# Patient Record
Sex: Female | Born: 1965
Health system: Southern US, Community
[De-identification: ages and names within clinical notes are randomized; demographics above are authoritative.]

## PROBLEM LIST (undated history)

## (undated) DIAGNOSIS — J302 Other seasonal allergic rhinitis: Secondary | ICD-10-CM

## (undated) DIAGNOSIS — D649 Anemia, unspecified: Secondary | ICD-10-CM

## (undated) DIAGNOSIS — F419 Anxiety disorder, unspecified: Secondary | ICD-10-CM

## (undated) DIAGNOSIS — L509 Urticaria, unspecified: Secondary | ICD-10-CM

## (undated) DIAGNOSIS — T8092XA Unspecified transfusion reaction, initial encounter: Secondary | ICD-10-CM

## (undated) HISTORY — DX: Other seasonal allergic rhinitis: J30.2

## (undated) HISTORY — DX: Unspecified transfusion reaction, initial encounter: T80.92XA

## (undated) HISTORY — DX: Urticaria, unspecified: L50.9

## (undated) HISTORY — DX: Anxiety disorder, unspecified: F41.9

## (undated) HISTORY — DX: Anemia, unspecified: D64.9

## (undated) HISTORY — PX: TUMOR REMOVAL: SHX12

## (undated) HISTORY — PX: APPENDECTOMY: SHX54

## (undated) HISTORY — PX: TUBAL LIGATION: SHX77

---

## 2008-11-07 ENCOUNTER — Emergency Department (HOSPITAL_COMMUNITY): Admission: EM | Admit: 2008-11-07 | Discharge: 2008-11-07 | Payer: Self-pay | Admitting: Family Medicine

## 2008-11-20 ENCOUNTER — Emergency Department (HOSPITAL_COMMUNITY): Admission: EM | Admit: 2008-11-20 | Discharge: 2008-11-20 | Payer: Self-pay | Admitting: Emergency Medicine

## 2010-11-30 LAB — POCT I-STAT, CHEM 8
Calcium, Ion: 1.22 mmol/L (ref 1.12–1.32)
Chloride: 107 mEq/L (ref 96–112)
Creatinine, Ser: 0.7 mg/dL (ref 0.4–1.2)
Glucose, Bld: 59 mg/dL — ABNORMAL LOW (ref 70–99)
Potassium: 3.9 mEq/L (ref 3.5–5.1)

## 2010-11-30 LAB — POCT URINALYSIS DIP (DEVICE)
Bilirubin Urine: NEGATIVE
Ketones, ur: NEGATIVE mg/dL
Nitrite: NEGATIVE
Protein, ur: NEGATIVE mg/dL
pH: 6 (ref 5.0–8.0)

## 2010-11-30 LAB — URINE CULTURE: Culture: NO GROWTH

## 2010-12-01 LAB — POCT URINALYSIS DIP (DEVICE)
Protein, ur: 100 mg/dL — AB
Specific Gravity, Urine: >=1.03 (ref 1.005–1.030)
Urobilinogen, UA: 1 mg/dL (ref 0.0–1.0)
pH: 6 (ref 5.0–8.0)

## 2012-06-05 ENCOUNTER — Ambulatory Visit (INDEPENDENT_AMBULATORY_CARE_PROVIDER_SITE_OTHER): Payer: 59 | Admitting: Internal Medicine

## 2012-06-05 VITALS — BP 108/67 | HR 69 | Temp 98.2°F | Resp 16 | Ht 63.0 in | Wt 133.0 lb

## 2012-06-05 DIAGNOSIS — B029 Zoster without complications: Secondary | ICD-10-CM

## 2012-06-05 MED ORDER — TRAMADOL HCL 50 MG PO TABS: 50.0000 mg | ORAL_TABLET | Freq: Three times a day (TID) | ORAL | Status: DC | PRN

## 2012-06-05 MED ORDER — VALACYCLOVIR HCL 1 G PO TABS: 1000.0000 mg | ORAL_TABLET | Freq: Three times a day (TID) | ORAL | Status: DC

## 2012-06-05 NOTE — Progress Notes (Signed)
  Subjective:    Patient ID: Danielle Banks, female    DOB: 03/15/1966, 46 y.o.   MRN: [id number]  HPI Rash  leftt chest wal Onset 4 days ago Pain started before the rash Severe 10/10 No fever  l  Review of Systems  Constitutional: Negative.   HENT: Negative.   Eyes: Negative.   Respiratory: Negative.   Cardiovascular: Negative.   Gastrointestinal: Negative.   Genitourinary: Negative.   Musculoskeletal: Negative.   Skin: Positive for rash.  Neurological: Negative.   Hematological: Negative.   Psychiatric/Behavioral: Negative.   All other systems reviewed and are negative.       Objective:   Physical Exam  Vitals reviewed. Constitutional: She appears well-developed and well-nourished.  HENT:  Head: Normocephalic and atraumatic.  Right Ear: External ear normal.  Left Ear: External ear normal.  Nose: Nose normal.  Mouth/Throat: Oropharynx is clear and moist.  Eyes: Conjunctivae normal and EOM are normal. Pupils are equal, round, and reactive to light.  Neck: Normal range of motion. Neck supple.  Cardiovascular: Normal rate, regular rhythm and normal heart sounds.   Pulmonary/Chest: Effort normal and breath sounds normal.  Abdominal: Soft. Bowel sounds are normal.  Musculoskeletal: Normal range of motion.  Neurological: She is alert. She has normal reflexes.  Skin: Rash noted.       Vesicular rash left chest wall in a dermatome distribution extending towards the back  Psychiatric: She has a normal mood and affect. Her behavior is normal. Judgment and thought content normal.          Assessment & Plan:  Shingles plan antivirals and analgesics

## 2012-06-05 NOTE — Patient Instructions (Signed)
Valtrex as directed. Ultram as directed for pain. followup in one week.

## 2012-08-02 ENCOUNTER — Encounter (HOSPITAL_COMMUNITY): Payer: Self-pay | Admitting: Emergency Medicine

## 2012-08-02 ENCOUNTER — Emergency Department (HOSPITAL_COMMUNITY)
Admission: EM | Admit: 2012-08-02 | Discharge: 2012-08-02 | Disposition: A | Discharge status: Home / Self Care | Payer: 59 | Attending: Emergency Medicine | Admitting: Emergency Medicine

## 2012-08-02 ENCOUNTER — Emergency Department (HOSPITAL_COMMUNITY): Payer: 59

## 2012-08-02 DIAGNOSIS — Z862 Personal history of diseases of the blood and blood-forming organs and certain disorders involving the immune mechanism: Secondary | ICD-10-CM | POA: Insufficient documentation

## 2012-08-02 DIAGNOSIS — R109 Unspecified abdominal pain: Secondary | ICD-10-CM | POA: Insufficient documentation

## 2012-08-02 DIAGNOSIS — Z79899 Other long term (current) drug therapy: Secondary | ICD-10-CM | POA: Insufficient documentation

## 2012-08-02 DIAGNOSIS — R197 Diarrhea, unspecified: Secondary | ICD-10-CM | POA: Insufficient documentation

## 2012-08-02 DIAGNOSIS — IMO0001 Reserved for inherently not codable concepts without codable children: Secondary | ICD-10-CM | POA: Insufficient documentation

## 2012-08-02 DIAGNOSIS — F411 Generalized anxiety disorder: Secondary | ICD-10-CM | POA: Insufficient documentation

## 2012-08-02 DIAGNOSIS — Z3202 Encounter for pregnancy test, result negative: Secondary | ICD-10-CM | POA: Insufficient documentation

## 2012-08-02 DIAGNOSIS — R5381 Other malaise: Secondary | ICD-10-CM | POA: Insufficient documentation

## 2012-08-02 DIAGNOSIS — R5383 Other fatigue: Secondary | ICD-10-CM | POA: Insufficient documentation

## 2012-08-02 DIAGNOSIS — R112 Nausea with vomiting, unspecified: Secondary | ICD-10-CM | POA: Insufficient documentation

## 2012-08-02 LAB — CBC WITH DIFFERENTIAL/PLATELET
Basophils Absolute: 0 10*3/uL (ref 0.0–0.1)
Eosinophils Absolute: 0.1 10*3/uL (ref 0.0–0.7)
Eosinophils Relative: 1 % (ref 0–5)
HCT: 37.3 % (ref 36.0–46.0)
Lymphocytes Relative: 2 % — ABNORMAL LOW (ref 12–46)
MCH: 27.8 pg (ref 26.0–34.0)
MCHC: 33 g/dL (ref 30.0–36.0)
MCV: 84.4 fL (ref 78.0–100.0)
Monocytes Absolute: 0.2 10*3/uL (ref 0.1–1.0)
Platelets: 241 10*3/uL (ref 150–400)
RDW: 14.6 % (ref 11.5–15.5)
WBC: 11 10*3/uL — ABNORMAL HIGH (ref 4.0–10.5)

## 2012-08-02 LAB — URINALYSIS, ROUTINE W REFLEX MICROSCOPIC
Hgb urine dipstick: NEGATIVE
Nitrite: NEGATIVE
Specific Gravity, Urine: 1.029 (ref 1.005–1.030)
Urobilinogen, UA: 0.2 mg/dL (ref 0.0–1.0)
pH: 7.5 (ref 5.0–8.0)

## 2012-08-02 LAB — COMPREHENSIVE METABOLIC PANEL
AST: 19 U/L (ref 0–37)
CO2: 24 mEq/L (ref 19–32)
Calcium: 8.7 mg/dL (ref 8.4–10.5)
Creatinine, Ser: 0.6 mg/dL (ref 0.50–1.10)
GFR calc Af Amer: >90 mL/min (ref >90)
GFR calc non Af Amer: >90 mL/min (ref >90)
Total Protein: 6.7 g/dL (ref 6.0–8.3)

## 2012-08-02 LAB — LIPASE, BLOOD: Lipase: 22 U/L (ref 11–59)

## 2012-08-02 LAB — PREGNANCY, URINE: Preg Test, Ur: NEGATIVE

## 2012-08-02 MED ORDER — MORPHINE SULFATE 4 MG/ML IJ SOLN: 4.0000 mg | Freq: Once | INTRAMUSCULAR | Status: DC

## 2012-08-02 MED ORDER — HYOSCYAMINE SULFATE 0.125 MG SL SUBL: 0.1250 mg | SUBLINGUAL_TABLET | SUBLINGUAL | Status: DC | PRN

## 2012-08-02 MED ORDER — MORPHINE SULFATE 4 MG/ML IJ SOLN
2.0000 mg | Freq: Once | INTRAMUSCULAR | Status: AC
  Administered 2012-08-02: 2 mg via INTRAVENOUS
  Filled 2012-08-02: qty 1

## 2012-08-02 MED ORDER — ONDANSETRON 8 MG PO TBDP: 8.0000 mg | ORAL_TABLET | Freq: Three times a day (TID) | ORAL | Status: DC | PRN

## 2012-08-02 MED ORDER — SODIUM CHLORIDE 0.9 % IV BOLUS (SEPSIS)
1000.0000 mL | Freq: Once | INTRAVENOUS | Status: AC
  Administered 2012-08-02: 1000 mL via INTRAVENOUS

## 2012-08-02 MED ORDER — ONDANSETRON HCL 4 MG/2ML IJ SOLN
4.0000 mg | Freq: Once | INTRAMUSCULAR | Status: AC
  Administered 2012-08-02: 4 mg via INTRAVENOUS
  Filled 2012-08-02: qty 2

## 2012-08-02 MED ORDER — PANTOPRAZOLE SODIUM 40 MG IV SOLR
40.0000 mg | Freq: Once | INTRAVENOUS | Status: AC
  Administered 2012-08-02: 40 mg via INTRAVENOUS
  Filled 2012-08-02: qty 40

## 2012-08-02 NOTE — Discharge Instructions (Signed)
Stay on clear fluids today. Zofran for nausea as prescribed as needed. Advance diet tomorrow as tolerate. Follow up with primary care doctor. Return if symptoms are worsening, if develop worsening nausea, vomiting, abdominal pain, unable to keep fluids down at home, or any new concerning finding.     Viral Gastroenteritis Viral gastroenteritis is also known as stomach flu. This condition affects the stomach and intestinal tract. It can cause sudden diarrhea and vomiting. The illness typically lasts 3 to 8 days. Most people develop an immune response that eventually gets rid of the virus. While this natural response develops, the virus can make you quite ill. CAUSES  Many different viruses can cause gastroenteritis, such as rotavirus or noroviruses. You can catch one of these viruses by consuming contaminated food or water. You may also catch a virus by sharing utensils or other personal items with an infected person or by touching a contaminated surface. SYMPTOMS  The most common symptoms are diarrhea and vomiting. These problems can cause a severe loss of body fluids (dehydration) and a body salt (electrolyte) imbalance. Other symptoms may include:  Fever.  Headache.  Fatigue.  Abdominal pain. DIAGNOSIS  Your caregiver can usually diagnose viral gastroenteritis based on your symptoms and a physical exam. A stool sample may also be taken to test for the presence of viruses or other infections. TREATMENT  This illness typically goes away on its own. Treatments are aimed at rehydration. The most serious cases of viral gastroenteritis involve vomiting so severely that you are not able to keep fluids down. In these cases, fluids must be given through an intravenous line (IV). HOME CARE INSTRUCTIONS   Drink enough fluids to keep your urine clear or pale yellow. Drink small amounts of fluids frequently and increase the amounts as tolerated.  Ask your caregiver for specific rehydration  instructions.  Avoid:  Foods high in sugar.  Alcohol.  Carbonated drinks.  Tobacco.  Juice.  Caffeine drinks.  Extremely hot or cold fluids.  Fatty, greasy foods.  Too much intake of anything at one time.  Dairy products until 24 to 48 hours after diarrhea stops.  You may consume probiotics. Probiotics are active cultures of beneficial bacteria. They may lessen the amount and number of diarrheal stools in adults. Probiotics can be found in yogurt with active cultures and in supplements.  Wash your hands well to avoid spreading the virus.  Only take over-the-counter or prescription medicines for pain, discomfort, or fever as directed by your caregiver. Do not give aspirin to children. Antidiarrheal medicines are not recommended.  Ask your caregiver if you should continue to take your regular prescribed and over-the-counter medicines.  Keep all follow-up appointments as directed by your caregiver. SEEK IMMEDIATE MEDICAL CARE IF:   You are unable to keep fluids down.  You do not urinate at least once every 6 to 8 hours.  You develop shortness of breath.  You notice blood in your stool or vomit. This may look like coffee grounds.  You have abdominal pain that increases or is concentrated in one small area (localized).  You have persistent vomiting or diarrhea.  You have a fever.  The patient is a child younger than 3 months, and he or she has a fever.  The patient is a child older than 3 months, and he or she has a fever and persistent symptoms.  The patient is a child older than 3 months, and he or she has a fever and symptoms suddenly get worse.  The patient is a baby, and he or she has no tears when crying. MAKE SURE YOU:   Understand these instructions.  Will watch your condition.  Will get help right away if you are not doing well or get worse. Document Released: 08/07/2005 Document Revised: 10/30/2011 Document Reviewed: 05/24/2011 [facility] Patient  Information 2013 [place], [place].

## 2012-08-02 NOTE — ED Provider Notes (Signed)
History     CSN: [id number]  Arrival date & time 08/02/12  [id number]   First MD Initiated Contact with Patient 08/02/12 0348      No chief complaint on file.   (Consider location/radiation/quality/duration/timing/severity/associated sxs/prior treatment) HPI Danielle Banks is a 46 y.o. female who presents with complaint of nausea, vomiting, abdominal pain, 1 episode of diarrhea since 6pm last night. Pt states she has vomited more than 10 times, unable to keep anything down. States pain in abdomen mainly epigastric, thinks it is from vomiting, describes as "cramping." Pt's husband states "I think she has ulcers, she had blood in one of the episodes of vomiting, and she always has stomach problems."  Pt denies fever, chills. No back pain or urinary symptoms. States feels very weak.  Nothing making her symptoms better or worse. 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  Past Medical History  Diagnosis Date  . Seasonal allergies   . Anemia   . Anxiety   . Blood transfusion abn reaction or complication, no procedure mishap     Past Surgical History  Procedure Date  . Appendectomy   . Tubal ligation   . Tumor removal     necrotic tumor removal    Family History  Problem Relation Age of Onset  . Hyperlipidemia Mother   . Osteoarthritis Mother   . Heart failure Father   . Hypertension Father   . Asthma Sister   . Asthma Daughter   . Hypertension Maternal Grandmother   . Stroke Paternal Grandmother   . Early death Paternal Grandfather     History  Substance Use Topics  . Smoking status: Never Smoker   . Smokeless tobacco: Not on file  . Alcohol Use: No    OB History    Grav Para Term Preterm Abortions TAB SAB Ect Mult Living                  Review of Systems  Constitutional: Positive for fatigue. Negative for fever and chills.  HENT: Negative for neck pain and neck stiffness.   Respiratory: Negative.   Cardiovascular: Negative.   Gastrointestinal: Positive for nausea,  vomiting, abdominal pain and diarrhea.  Genitourinary: Negative for dysuria, flank pain, vaginal bleeding, vaginal discharge and pelvic pain.  Musculoskeletal: Positive for myalgias. Negative for back pain.  Neurological: Positive for weakness. Negative for dizziness, light-headedness and headaches.  All other systems reviewed and are negative.    Allergies  Kiwi extract  Home Medications   Current Outpatient Rx  Name  Route  Sig  Dispense  Refill  . PAROXETINE HCL 10 MG PO TABS   Oral   Take 10 mg by mouth every morning.         Marland Kitchen TRAMADOL HCL 50 MG PO TABS   Oral   Take 1 tablet (50 mg total) by mouth every 8 (eight) hours as needed for pain.   30 tablet   0   . VALACYCLOVIR HCL 1 G PO TABS   Oral   Take 1 tablet (1,000 mg total) by mouth 3 (three) times daily.   20 tablet   0     BP 105/54  Pulse 110  Temp 98.7 F (37.1 C) (Oral)  Resp 16  Ht 5\' 2"  (1.575 m)  Wt 118 lb (53.524 kg)  BMI 21.58 kg/m2  SpO2 98%  LMP 07/26/2012  Physical Exam  Nursing note and vitals reviewed. Constitutional: She is oriented to person, place, and time. She  appears well-developed and well-nourished. No distress.  HENT:       Oral mucosa dry, lips dry  Neck: Neck supple.  Cardiovascular: Regular rhythm and normal heart sounds.        tachycardic  Pulmonary/Chest: Effort normal and breath sounds normal. No respiratory distress. She has no wheezes. She has no rales.  Abdominal: Soft. Bowel sounds are normal. She exhibits no distension. There is tenderness. There is no rebound and no guarding.       Diffuse abdominal tenderness. Worst in the epigastric area  Musculoskeletal: She exhibits no edema.  Neurological: She is alert and oriented to person, place, and time.  Skin: Skin is warm and dry.    ED Course  Procedures (including critical care time)  4:03 AM Pt seen and examined. Pt is a nurse in ICU. Pt with sudden onset of nausea, vomiting, abdominal pain, diarrhea. Will  fluids, medications, labs pending.  Results for orders placed during the hospital encounter of 08/02/12  CBC WITH DIFFERENTIAL      Component Value Range   WBC 11.0 (*) 4.0 - 10.5 K/uL   RBC 4.42  3.87 - 5.11 MIL/uL   Hemoglobin 12.3  12.0 - 15.0 g/dL   HCT 16.1  09.6 - 04.5 %   MCV 84.4  78.0 - 100.0 fL   MCH 27.8  26.0 - 34.0 pg   MCHC 33.0  30.0 - 36.0 g/dL   RDW 40.9  81.1 - 91.4 %   Platelets 241  150 - 400 K/uL   Neutrophils Relative 96 (*) 43 - 77 %   Neutro Abs 10.5 (*) 1.7 - 7.7 K/uL   Lymphocytes Relative 2 (*) 12 - 46 %   Lymphs Abs 0.2 (*) 0.7 - 4.0 K/uL   Monocytes Relative 2 (*) 3 - 12 %   Monocytes Absolute 0.2  0.1 - 1.0 K/uL   Eosinophils Relative 1  0 - 5 %   Eosinophils Absolute 0.1  0.0 - 0.7 K/uL   Basophils Relative 0  0 - 1 %   Basophils Absolute 0.0  0.0 - 0.1 K/uL  COMPREHENSIVE METABOLIC PANEL      Component Value Range   Sodium 137  135 - 145 mEq/L   Potassium 3.9  3.5 - 5.1 mEq/L   Chloride 103  96 - 112 mEq/L   CO2 24  19 - 32 mEq/L   Glucose, Bld 157 (*) 70 - 99 mg/dL   BUN 16  6 - 23 mg/dL   Creatinine, Ser 7.82  0.50 - 1.10 mg/dL   Calcium 8.7  8.4 - 95.6 mg/dL   Total Protein 6.7  6.0 - 8.3 g/dL   Albumin 4.0  3.5 - 5.2 g/dL   AST 19  0 - 37 U/L   ALT 7  0 - 35 U/L   Alkaline Phosphatase 58  39 - 117 U/L   Total Bilirubin 0.3  0.3 - 1.2 mg/dL   GFR calc non Af Amer >90  >90 mL/min   GFR calc Af Amer >90  >90 mL/min  URINALYSIS, ROUTINE W REFLEX MICROSCOPIC      Component Value Range   Color, Urine YELLOW  YELLOW   APPearance CLEAR  CLEAR   Specific Gravity, Urine 1.029  1.005 - 1.030   pH 7.5  5.0 - 8.0   Glucose, UA NEGATIVE  NEGATIVE mg/dL   Hgb urine dipstick NEGATIVE  NEGATIVE   Bilirubin Urine NEGATIVE  NEGATIVE   Ketones, ur NEGATIVE  NEGATIVE mg/dL   Protein, ur NEGATIVE  NEGATIVE mg/dL   Urobilinogen, UA 0.2  0.0 - 1.0 mg/dL   Nitrite NEGATIVE  NEGATIVE   Leukocytes, UA NEGATIVE  NEGATIVE  LIPASE, BLOOD      Component  Value Range   Lipase 22  11 - 59 U/L  PREGNANCY, URINE      Component Value Range   Preg Test, Ur NEGATIVE  NEGATIVE   Dg Abd Acute W/chest  08/02/2012  *RADIOLOGY REPORT*  Clinical Data: Upper abdominal pain and nausea.  ACUTE ABDOMEN SERIES (ABDOMEN 2 VIEW & CHEST 1 VIEW)  Comparison: None.  Findings: Normal heart size and pulmonary vascularity.  No focal consolidation in the lungs.  No blunting of costophrenic angles.  Scattered gas and stool in the colon.  No small or large bowel dilatation.  No free intra-abdominal air.  No abnormal air fluid levels.  No radiopaque stones.  IMPRESSION: No evidence of active pulmonary disease.  Stool filled colon. Nonobstructive bowel gas pattern.   Original Report Authenticated By: Burman Nieves, M.D.     Pt feeling better. Signed out at shift change to Dr. Hyacinth Meeker.   No diagnosis found.    MDM          Lottie Mussel, PA 08/05/12 [phone]

## 2012-08-02 NOTE — ED Notes (Signed)
Patient transported to X-ray 

## 2012-08-02 NOTE — ED Notes (Signed)
MD at bedside. 

## 2012-08-02 NOTE — ED Provider Notes (Signed)
46 year old female who presents with one day of nausea vomiting and mild abdominal tenderness. 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  On arrival the patient was extremely nauseated, she has not had any vomiting since medications and states that she feels much better. She has not had any diarrhea. Her abdomen is soft and minimally tender in the epigastrium, she has no guarding, no peritoneal signs and no tenderness at McBurney's point. Lab work overall is unremarkable, the patient has improved significantly, has been given 2 L of IV fluids and symptomatic medications and appears stable for discharge. She has a normal lipase, urinalysis is pending.  Medical screening examination/treatment/procedure(s) were conducted as a shared visit with non-physician practitioner(s) and myself.  I personally evaluated the patient during the encounter    Vida Roller, MD 08/02/12 [phone]

## 2012-08-05 NOTE — ED Provider Notes (Signed)
  Medical screening examination/treatment/procedure(s) were conducted as a shared visit with non-physician practitioner(s) and myself.  I personally evaluated the patient during the encounter  Please see my separate respective documentation pertaining to this patient encounter   Vida Roller, MD 08/05/12 0730

## 2012-10-12 ENCOUNTER — Ambulatory Visit (INDEPENDENT_AMBULATORY_CARE_PROVIDER_SITE_OTHER): Payer: 59 | Admitting: Emergency Medicine

## 2012-10-12 ENCOUNTER — Ambulatory Visit: Payer: 59

## 2012-10-12 VITALS — BP 124/76 | HR 87 | Temp 98.1°F | Resp 16 | Ht 62.5 in | Wt 132.0 lb

## 2012-10-12 DIAGNOSIS — R079 Chest pain, unspecified: Secondary | ICD-10-CM

## 2012-10-12 DIAGNOSIS — S20219A Contusion of unspecified front wall of thorax, initial encounter: Secondary | ICD-10-CM

## 2012-10-12 MED ORDER — NAPROXEN SODIUM 550 MG PO TABS: 550.0000 mg | ORAL_TABLET | Freq: Two times a day (BID) | ORAL | Status: DC
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
## 2012-10-12 NOTE — Patient Instructions (Signed)
Chest Pain (Nonspecific) It is often hard to give a specific diagnosis for the cause of chest pain. There is always a chance that your pain could be related to something serious, such as a heart attack or a blood clot in the lungs. You need to follow up with your caregiver for further evaluation. CAUSES   Heartburn.  Pneumonia or bronchitis.  Anxiety or stress.  Inflammation around your heart (pericarditis) or lung (pleuritis or pleurisy).  A blood clot in the lung.  A collapsed lung (pneumothorax). It can develop suddenly on its own (spontaneous pneumothorax) or from injury (trauma) to the chest.  Shingles infection (herpes zoster virus). The chest wall is composed of bones, muscles, and cartilage. Any of these can be the source of the pain.  The bones can be bruised by injury.  The muscles or cartilage can be strained by coughing or overwork.  The cartilage can be affected by inflammation and become sore (costochondritis). DIAGNOSIS  Lab tests or other studies, such as X-rays, electrocardiography, stress testing, or cardiac imaging, may be needed to find the cause of your pain.  TREATMENT   Treatment depends on what may be causing your chest pain. Treatment may include:  Acid blockers for heartburn.  Anti-inflammatory medicine.  Pain medicine for inflammatory conditions.  Antibiotics if an infection is present.  You may be advised to change lifestyle habits. This includes stopping smoking and avoiding alcohol, caffeine, and chocolate.  You may be advised to keep your head raised (elevated) when sleeping. This reduces the chance of acid going backward from your stomach into your esophagus.  Most of the time, nonspecific chest pain will improve within 2 to 3 days with rest and mild pain medicine. HOME CARE INSTRUCTIONS   If antibiotics were prescribed, take your antibiotics as directed. Finish them even if you start to feel better.  For the next few days, avoid physical  activities that bring on chest pain. Continue physical activities as directed.  Do not smoke.  Avoid drinking alcohol.  Only take over-the-counter or prescription medicine for pain, discomfort, or fever as directed by your caregiver.  Follow your caregiver's suggestions for further testing if your chest pain does not go away.  Keep any follow-up appointments you made. If you do not go to an appointment, you could develop lasting (chronic) problems with pain. If there is any problem keeping an appointment, you must call to reschedule. SEEK MEDICAL CARE IF:   You think you are having problems from the medicine you are taking. Read your medicine instructions carefully.  Your chest pain does not go away, even after treatment.  You develop a rash with blisters on your chest. SEEK IMMEDIATE MEDICAL CARE IF:   You have increased chest pain or pain that spreads to your arm, neck, jaw, back, or abdomen.  You develop shortness of breath, an increasing cough, or you are coughing up blood.  You have severe back or abdominal pain, feel nauseous, or vomit.  You develop severe weakness, fainting, or chills.  You have a fever. THIS IS AN EMERGENCY. Do not wait to see if the pain will go away. Get medical help at once. Call your local emergency services (911 in [place] Do not drive yourself to the hospital. MAKE SURE YOU:   Understand these instructions.  Will watch your condition.  Will get help right away if you are not doing well or get worse. Document Released: 05/17/2005 Document Revised: 10/30/2011 Document Reviewed: 03/12/2008 [employer] Patient Information 2013 [employer].  

## 2012-10-16 NOTE — Progress Notes (Signed)
Reviewed and agree.

## 2012-11-19 ENCOUNTER — Ambulatory Visit (INDEPENDENT_AMBULATORY_CARE_PROVIDER_SITE_OTHER): Payer: 59 | Admitting: Family Medicine

## 2012-11-19 VITALS — HR 90 | Temp 98.3°F | Resp 18 | Ht 62.5 in | Wt 134.0 lb

## 2012-11-19 DIAGNOSIS — F411 Generalized anxiety disorder: Secondary | ICD-10-CM

## 2012-11-19 DIAGNOSIS — J069 Acute upper respiratory infection, unspecified: Secondary | ICD-10-CM

## 2012-11-19 DIAGNOSIS — D509 Iron deficiency anemia, unspecified: Secondary | ICD-10-CM

## 2012-11-19 LAB — POCT CBC
Granulocyte percent: 59.3 %G (ref 37–80)
HCT, POC: 38.9 % (ref 37.7–47.9)
MCV: 86.9 fL (ref 80–97)
RBC: 4.48 M/uL (ref 4.04–5.48)

## 2012-11-19 MED ORDER — PAROXETINE HCL 10 MG PO TABS: 10.0000 mg | ORAL_TABLET | Freq: Every morning | ORAL | Status: DC

## 2012-11-19 NOTE — Progress Notes (Signed)
Subjective: 47 year old lady with several things. She needs a refill on her Paxil. She has been on a low dose of that for several years for anxiety. She does fair on that. She does continue to smoke. We discussed the need for quitting smoking.  She also has history deficiency anemia. She has taken iron often for this. She's been using a sample another physician gave her call Integra. She does have fairly heavy periods. She has a long history of anemia problems.  She has an upper sparked or infection last few days with a congestion sneezing sore throat and mild cough which is nonproductive. She's felt hot but not had any documented fever.  Objective: Pleasant alert lady in no major distress. TMs normal. Throat clear. Neck supple without nodes. Chest clear process. Heart regular without murmurs. No carotid bruits.  Assessment: History of anemia URI Chronic mild anxiety Tobacco abuse  Plan: Pressure tenderness of smoking. Check CBC and ferritin on her. Represcribed Paxil. Treat the URI symptomatically. Return when necessary.  Results for orders placed in visit on 11/19/12  POCT CBC      Result Value Range   WBC 6.1  4.6 - 10.2 K/uL   Lymph, poc 2.0  0.6 - 3.4   POC LYMPH PERCENT 32.6  10 - 50 %L   MID (cbc) 0.5  0 - 0.9   POC MID % 8.1  0 - 12 %M   POC Granulocyte 3.6  2 - 6.9   Granulocyte percent 59.3  37 - 80 %G   RBC 4.48  4.04 - 5.48 M/uL   Hemoglobin 12.0 (*) 12.2 - 16.2 g/dL   HCT, POC 16.1  09.6 - 47.9 %   MCV 86.9  80 - 97 fL   MCH, POC 26.8 (*) 27 - 31.2 pg   MCHC 30.8 (*) 31.8 - 35.4 g/dL   RDW, POC 04.5     Platelet Count, POC 354  142 - 424 K/uL   MPV 8.2  0 - 99.8 fL   She is not fatigued from behind deficiency. I urge her just to try and take iron regularly for about 3 months and see if she can build up her marrow stores, then dropped back to just using it at the time of her menstrual cycles.  Return if worse

## 2012-11-19 NOTE — Patient Instructions (Signed)
Fluids  Rest  Ask for a polysaccharide iron preparation and see if if they can recommend for you  Continue Paxil same dose  Return if problems

## 2012-11-21 ENCOUNTER — Other Ambulatory Visit: Payer: Self-pay | Admitting: Radiology

## 2012-11-21 NOTE — Telephone Encounter (Signed)
error 

## 2012-12-07 ENCOUNTER — Ambulatory Visit (INDEPENDENT_AMBULATORY_CARE_PROVIDER_SITE_OTHER): Payer: 59 | Admitting: Physician Assistant

## 2012-12-07 VITALS — BP 101/66 | HR 77 | Temp 99.2°F | Resp 18 | Wt 134.0 lb

## 2012-12-07 DIAGNOSIS — R81 Glycosuria: Secondary | ICD-10-CM

## 2012-12-07 DIAGNOSIS — R300 Dysuria: Secondary | ICD-10-CM

## 2012-12-07 DIAGNOSIS — N390 Urinary tract infection, site not specified: Secondary | ICD-10-CM

## 2012-12-07 DIAGNOSIS — Z131 Encounter for screening for diabetes mellitus: Secondary | ICD-10-CM

## 2012-12-07 LAB — POCT URINALYSIS DIPSTICK
Bilirubin, UA: NEGATIVE
Glucose, UA: 100
Ketones, UA: 15
Nitrite, UA: NEGATIVE
Protein, UA: >=300
Spec Grav, UA: >=1.03
Urobilinogen, UA: 0.2
pH, UA: 6

## 2012-12-07 LAB — POCT UA - MICROSCOPIC ONLY
Casts, Ur, LPF, POC: NEGATIVE
Crystals, Ur, HPF, POC: NEGATIVE
Mucus, UA: NEGATIVE
Yeast, UA: NEGATIVE

## 2012-12-07 LAB — POCT GLYCOSYLATED HEMOGLOBIN (HGB A1C): Hemoglobin A1C: 5.2

## 2012-12-07 LAB — GLUCOSE, POCT (MANUAL RESULT ENTRY): POC Glucose: 100 mg/dl — AB (ref 70–99)

## 2012-12-07 MED ORDER — SULFAMETHOXAZOLE-TRIMETHOPRIM 800-160 MG PO TABS: 1.0000 | ORAL_TABLET | Freq: Two times a day (BID) | ORAL | Status: DC

## 2012-12-07 MED ORDER — PHENAZOPYRIDINE HCL 200 MG PO TABS: 200.0000 mg | ORAL_TABLET | Freq: Three times a day (TID) | ORAL | Status: DC | PRN

## 2012-12-07 NOTE — Progress Notes (Signed)
Patient ID: Danielle Banks MRN: [id number], DOB: 01/28/1966, 47 y.o. Date of Encounter: 12/07/2012, 6:14 PM  Primary Physician: No primary provider on file.  Chief Complaint: urinary frequency and dysuria  HPI: 47 y.o. year old female with presents with 2 day history of urinary frequency, dysuria, and suprapubic pressure. Complains of spasms of bladder that are extremely uncomfortable.  Denies flank pain, nausea, vomiting, fever, chills, vaginal discharge, or odor. Has tried OTC medication for bladder infections which has not helped. LNMP 4 weeks ago - due to start soon.  Patient is otherwise doing well without issues or complaints.  Past Medical History  Diagnosis Date  . Seasonal allergies   . Anemia   . Anxiety   . Blood transfusion abn reaction or complication, no procedure mishap      Home Meds: Prior to Admission medications   Medication Sig Start Date End Date Taking? Authorizing Provider  Fe Fum-FePoly-FA-Vit C-Vit B3 (INTEGRA F) 125-1 MG CAPS Take 125 mg by mouth at bedtime as needed.    Historical Provider, MD  phenazopyridine (PYRIDIUM) 200 MG tablet Take 1 tablet (200 mg total) by mouth 3 (three) times daily as needed for pain. 12/07/12   Carter Kaman Jaquita Rector, PA-C  sulfamethoxazole-trimethoprim (BACTRIM DS,SEPTRA DS) 800-160 MG per tablet Take 1 tablet by mouth 2 (two) times daily. 12/07/12   Nelva Nay, PA-C    Allergies:  Allergies  Allergen Reactions  . Kiwi Extract Other (See Comments)    Lip tingling and numbness    History   Social History  . Marital Status: Single    Spouse Name: N/A    Number of Children: N/A  . Years of Education: College   Occupational History  . Nurse [employer]   Social History Main Topics  . Smoking status: Never Smoker   . Smokeless tobacco: Not on file  . Alcohol Use: No  . Drug Use: No  . Sexually Active: Yes    Birth Control/ Protection: Other-see comments, Surgical     Comment: Tubal ligation   Other Topics Concern    . Not on file   Social History Narrative  . No narrative on file     Review of Systems: Constitutional: negative for chills, fever, night sweats, weight changes, or fatigue  HEENT: negative for vision changes, hearing loss, congestion, rhinorrhea, ST, epistaxis, or sinus pressure Cardiovascular: negative for chest pain or palpitations Respiratory: negative for cough, hemoptysis, wheezing, shortness of breath. Abdominal: positive for suprapubic abdominal pain,   No nausea, vomiting, diarrhea, or constipation Genitourinary: positive for urinary frequency and dysuria. Negative for vaginal discharge, odor, or pelvic pain.  Dermatological: negative for rashes. Neurologic: negative for headache, dizziness, or syncope   Physical Exam: Blood pressure 101/66, pulse 77, temperature 99.2 F (37.3 C), temperature source Oral, resp. rate 18, weight 134 lb (60.782 kg), last menstrual period 11/15/2012., Body mass index is 24.1 kg/(m^2). General: Well developed, well nourished, in no acute distress. Head: Normocephalic, atraumatic, eyes without discharge, sclera non-icteric, nares are without discharge. External ear normal in appearance. Neck: Supple. No thyromegaly. Full ROM. No lymphadenopathy. Lungs: Clear bilaterally to auscultation without wheezes, rales, or rhonchi. Breathing is unlabored. Heart: RRR with S1 S2. No murmurs, rubs, or gallops appreciated. Abdominal: +BS x 4. No hepatosplenomegaly, rebound tenderness, or guarding. Positive suprapubic tenderness. No CVA tenderness bilaterally.  Msk:  Strength and tone normal for age. Extremities/Skin: Warm and dry. No clubbing or cyanosis. No edema.  Neuro: Alert and oriented X 3. Moves  all extremities spontaneously. Gait is normal. CNII-XII grossly in tact. Psych:  Responds to questions appropriately with a normal affect.   Labs:  Results for orders placed in visit on 12/07/12  POCT UA - MICROSCOPIC ONLY      Result Value Range   WBC, Ur,  HPF, POC TNTC     RBC, urine, microscopic TNTC     Bacteria, U Microscopic trace     Mucus, UA neg     Epithelial cells, urine per micros 0-2     Crystals, Ur, HPF, POC neg     Casts, Ur, LPF, POC neg     Yeast, UA neg    POCT URINALYSIS DIPSTICK      Result Value Range   Color, UA yellow     Clarity, UA cloudy     Glucose, UA 100     Bilirubin, UA neg     Ketones, UA 15     Spec Grav, UA >=1.030     Blood, UA large     pH, UA 6.0     Protein, UA >=300     Urobilinogen, UA 0.2     Nitrite, UA neg     Leukocytes, UA small (1+)    GLUCOSE, POCT (MANUAL RESULT ENTRY)      Result Value Range   POC Glucose 100 (*) 70 - 99 mg/dl  POCT GLYCOSYLATED HEMOGLOBIN (HGB A1C)      Result Value Range   Hemoglobin A1C 5.2        Pyridium 200 mg given today in office.   ASSESSMENT AND PLAN:  47 y.o. year old female with urinary tract infection Urine culture sent Increase fluids.  HgA1c and glucose normal - unsure why glucosuria today. Recommend recheck when well.  Septra DS bid x 5 days Pyridium tid as needed for symptomatic relief Follow up if symptoms worsen or fail to improve.  Grier Mitts, PA-C 12/07/2012 6:14 PM

## 2012-12-10 LAB — URINE CULTURE: Colony Count: 4000

## 2012-12-11 ENCOUNTER — Encounter: Payer: Self-pay | Admitting: Physician Assistant

## 2012-12-11 ENCOUNTER — Telehealth: Payer: Self-pay

## 2012-12-11 NOTE — Telephone Encounter (Signed)
Pt is calling back about some labs  Call back number is [phone]

## 2012-12-11 NOTE — Telephone Encounter (Signed)
Return pt call- given lab results. She will follow up if needed.

## 2013-03-05 ENCOUNTER — Ambulatory Visit (INDEPENDENT_AMBULATORY_CARE_PROVIDER_SITE_OTHER): Payer: 59 | Admitting: Physician Assistant

## 2013-03-05 VITALS — BP 106/60 | HR 75 | Temp 98.0°F | Resp 18 | Ht 64.5 in | Wt 132.8 lb

## 2013-03-05 DIAGNOSIS — J019 Acute sinusitis, unspecified: Secondary | ICD-10-CM

## 2013-03-05 DIAGNOSIS — D649 Anemia, unspecified: Secondary | ICD-10-CM

## 2013-03-05 MED ORDER — PREDNISONE 20 MG PO TABS: ORAL_TABLET | ORAL | Status: DC

## 2013-03-05 MED ORDER — INTEGRA F 125-1 MG PO CAPS: 125.0000 mg | ORAL_CAPSULE | Freq: Every evening | ORAL | Status: DC | PRN

## 2013-03-05 MED ORDER — LEVOFLOXACIN 500 MG PO TABS: 500.0000 mg | ORAL_TABLET | Freq: Every day | ORAL | Status: DC
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
## 2013-03-31 ENCOUNTER — Ambulatory Visit (INDEPENDENT_AMBULATORY_CARE_PROVIDER_SITE_OTHER): Payer: 59 | Admitting: Physician Assistant

## 2013-03-31 VITALS — BP 108/60 | HR 66 | Temp 98.0°F | Resp 16 | Ht 64.0 in | Wt 135.0 lb

## 2013-03-31 DIAGNOSIS — J019 Acute sinusitis, unspecified: Secondary | ICD-10-CM

## 2013-03-31 MED ORDER — FLUTICASONE PROPIONATE 50 MCG/ACT NA SUSP: 2.0000 | Freq: Every day | NASAL | Status: DC

## 2013-03-31 MED ORDER — PREDNISONE 20 MG PO TABS: ORAL_TABLET | ORAL | Status: DC

## 2013-03-31 MED ORDER — CEFDINIR 300 MG PO CAPS: 600.0000 mg | ORAL_CAPSULE | Freq: Every day | ORAL | Status: DC

## 2013-03-31 NOTE — Progress Notes (Signed)
  Subjective:    Patient ID: Danielle Banks, female    DOB: January 30, 1966, 47 y.o.   MRN: [id number]  HPI This 47 y.o. female presents for evaluation of RIGHT sided congestion, facial pain, ear pain.  Similar to sinusitis last month, from which she recovered completely, but not yet as severe.  Notes some chronic underlying allergy-type symptoms, but nothing bad enough to take medications daily.  Current Outpatient Prescriptions on File Prior to Visit  Medication Sig Dispense Refill  . Fe Fum-FePoly-FA-Vit C-Vit B3 (INTEGRA F) 125-1 MG CAPS Take 125 mg by mouth at bedtime as needed.  30 capsule  11   No current facility-administered medications on file prior to visit.   Allergies  Allergen Reactions  . Kiwi Extract Other (See Comments)    Lip tingling and numbness   Past medical history, surgical history, family history, social history and problem list reviewed.    Review of Systems As above.    Objective:   Physical Exam Blood pressure 108/60, pulse 66, temperature 98 F (36.7 C), temperature source Oral, resp. rate 16, height 5\' 4"  (1.626 m), weight 135 lb (61.236 kg), last menstrual period 02/28/2013, SpO2 98.00%. Body mass index is 23.16 kg/(m^2). Well-developed, well nourished WF who is awake, alert and oriented, in NAD. HEENT: Andrews/AT, PERRL, EOMI.  Sclera and conjunctiva are clear.  EAC are patent, TMs are normal in appearance. Nasal mucosa is pink and moist. OP is clear. RIGHT maxillary sinus tenderness. Neck: supple, non-tender, no lymphadenopathy, thyromegaly. Heart: RRR, no murmur Lungs: normal effort, CTA Extremities: no cyanosis, clubbing or edema. Skin: warm and dry without rash. Psychologic: good mood and appropriate affect, normal speech and behavior.        Assessment & Plan:  Acute sinusitis, unspecified - Plan: cefdinir (OMNICEF) 300 MG capsule, predniSONE (DELTASONE) 20 MG tablet, fluticasone (FLONASE) 50 MCG/ACT nasal spray  Encourage her to use the steroid  nasal spray for 1-2 months (or longer) after her symptoms resolve to reduce the risk of recurrence.  Fernande Bras, PA-C Physician Assistant-Certified [facility]

## 2013-03-31 NOTE — Patient Instructions (Signed)
Use OTC Mucinex Maximum strength to help keep the mucous thin. Get plenty of rest and drink at least 64 ounces of water daily.

## 2013-04-15 ENCOUNTER — Ambulatory Visit (INDEPENDENT_AMBULATORY_CARE_PROVIDER_SITE_OTHER): Payer: 59 | Admitting: Family Medicine

## 2013-04-15 ENCOUNTER — Ambulatory Visit (HOSPITAL_COMMUNITY)
Admission: RE | Admit: 2013-04-15 | Discharge: 2013-04-15 | Disposition: A | Discharge status: Home / Self Care | Payer: 59 | Source: Ambulatory Visit | Attending: Family Medicine | Admitting: Family Medicine

## 2013-04-15 VITALS — BP 104/68 | HR 80 | Temp 98.9°F | Resp 20 | Ht 62.5 in | Wt 133.2 lb

## 2013-04-15 DIAGNOSIS — R51 Headache: Secondary | ICD-10-CM | POA: Insufficient documentation

## 2013-04-15 LAB — POCT CBC
Granulocyte percent: 53.5 %G (ref 37–80)
HCT, POC: 38.2 % (ref 37.7–47.9)
POC Granulocyte: 4.8 (ref 2–6.9)
POC LYMPH PERCENT: 40.6 %L (ref 10–50)
Platelet Count, POC: 303 10*3/uL (ref 142–424)
RBC: 4.4 M/uL (ref 4.04–5.48)
RDW, POC: 16.2 %

## 2013-04-15 LAB — COMPREHENSIVE METABOLIC PANEL
AST: 10 U/L (ref 0–37)
Albumin: 4.4 g/dL (ref 3.5–5.2)
Alkaline Phosphatase: 46 U/L (ref 39–117)
Chloride: 104 mEq/L (ref 96–112)
Glucose, Bld: 85 mg/dL (ref 70–99)
Potassium: 4.3 mEq/L (ref 3.5–5.3)
Sodium: 139 mEq/L (ref 135–145)
Total Protein: 6.4 g/dL (ref 6.0–8.3)

## 2013-04-15 MED ORDER — BUTALBITAL-APAP-CAFFEINE 50-325-40 MG PO TABS: 1.0000 | ORAL_TABLET | Freq: Four times a day (QID) | ORAL | Status: DC | PRN

## 2013-04-15 NOTE — Progress Notes (Addendum)
[facility] [address] [place] [address] [phone]  Date:  04/15/2013   Name:  Danielle Banks   DOB:  18-Jul-1966   MRN:  [id number]  PCP:  No PCP Per Patient    Chief Complaint: Otalgia and Headache   History of Present Illness:  Danielle Banks is a 47 y.o. very pleasant female patient who presents with the following:  She has noted pressure in her right eye and a daily headache since July of this year.  She is taking tylenol most days She was here 7/16- she was treated with Levaquin, prednisone, mucinex for presumed sinusitis She returned on 8/11 with continued right sided sinus congestion, facial and ear pain.  She had gotten better but never 100% well.  At that time she was given omnicef but only took one dose, repeated prednisone and has been taking flonase She felt that she responded to the first round of prednisone, but not to the second.    She has not suffered from chronic sinusitis in the past.  She is not aware of any allergen exposures.   She works as an Insurance underwriter  She has an appt with optho tomorrow.   Her vision does not seem to have changed.   She has not had any discharge from the eye.   No sore throat, no fever. No cough.  Occasional sneezing or runny nose.   She usually notes the HA when she is driving to work, or when exercising.   She does drink caffeine- a couple of sodas and cups of coffee each day, this is not a change from her usual routine.   She has not had any numbness or weakness, no syncope or slurred speech.   She has actually been woken up by her HA a couple of times    She is s/p BTL, LMP less than on month ago She does not wear corrective lenses- she used to but her eye doctor told her that she no longer needed to use them.    There are no active problems to display for this patient.   Past Medical History  Diagnosis Date  . Seasonal allergies   . Anemia   . Anxiety   . Blood transfusion abn reaction or  complication, no procedure mishap     Past Surgical History  Procedure Laterality Date  . Appendectomy    . Tubal ligation    . Tumor removal      necrotic tumor removal    History  Substance Use Topics  . Smoking status: Never Smoker   . Smokeless tobacco: Never Used  . Alcohol Use: No    Family History  Problem Relation Age of Onset  . Hyperlipidemia Mother   . Osteoarthritis Mother   . Hypothyroidism Mother   . Heart failure Father   . Hypertension Father   . Heart disease Father   . Asthma Sister   . Asthma Daughter   . Hypertension Maternal Grandmother   . Stroke Maternal Grandmother   . Stroke Paternal Grandmother   . Pulmonary embolism Paternal Grandmother   . Early death Paternal Grandfather   . Heart disease Paternal Grandfather   . Stroke Maternal Grandfather     Allergies  Allergen Reactions  . Kiwi Extract Other (See Comments)    Lip tingling and numbness    Medication list has been reviewed and updated.  Current Outpatient Prescriptions on File Prior to Visit  Medication Sig Dispense Refill  .  Fe Fum-FePoly-FA-Vit C-Vit B3 (INTEGRA F) 125-1 MG CAPS Take 125 mg by mouth at bedtime as needed.  30 capsule  11  . fluticasone (FLONASE) 50 MCG/ACT nasal spray Place 2 sprays into the nose daily.  16 g  12  . PARoxetine (PAXIL) 10 MG tablet Take 10 mg by mouth every morning.      . cefdinir (OMNICEF) 300 MG capsule Take 2 capsules (600 mg total) by mouth daily.  20 capsule  0  . predniSONE (DELTASONE) 20 MG tablet 3 PO FOR 3 DAYS, 2 PO FOR 3 DAYS, 1 PO FOR 3 DAYS  18 tablet  0   No current facility-administered medications on file prior to visit.    Review of Systems:  As per HPI- otherwise negative.   Physical Examination: Filed Vitals:   04/15/13 0907  BP: 104/68  Pulse: 80  Temp: 98.9 F (37.2 C)  Resp: 20   Filed Vitals:   04/15/13 0907  Height: 5' 2.5" (1.588 m)  Weight: 133 lb 3.2 oz (60.419 kg)   Body mass index is 23.96  kg/(m^2). Ideal Body Weight: Weight in (lb) to have BMI = 25: 138.6  GEN: WDWN, NAD, Non-toxic, A & O x 3, looks well HEENT: Atraumatic, Normocephalic. Neck supple. No masses, No LAD.  Bilateral TM wnl, oropharynx normal.  PEERL,EOMI.   Ears and Nose: No external deformity. CV: RRR, No M/G/R. No JVD. No thrill. No extra heart sounds. PULM: CTA B, no wheezes, crackles, rhonchi. No retractions. No resp. distress. No accessory muscle use. ABD: S, NT, ND, +BS. No rebound. No HSM. EXTR: No c/c/e NEURO Normal gait. Normal strength and DTR all extremities.  Normal facial sensation and movement.  She does wobble on Romberg testing but is able to do test PSYCH: Normally interactive. Conversant. Not depressed or anxious appearing.  Calm demeanor.   Results for orders placed in visit on 04/15/13  POCT CBC      Result Value Range   WBC 8.9  4.6 - 10.2 K/uL   Lymph, poc 3.6 (*) 0.6 - 3.4   POC LYMPH PERCENT 40.6  10 - 50 %L   MID (cbc) 0.5  0 - 0.9   POC MID % 5.9  0 - 12 %M   POC Granulocyte 4.8  2 - 6.9   Granulocyte percent 53.5  37 - 80 %G   RBC 4.40  4.04 - 5.48 M/uL   Hemoglobin 12.0 (*) 12.2 - 16.2 g/dL   HCT, POC 16.1  09.6 - 47.9 %   MCV 86.8  80 - 97 fL   MCH, POC 27.3  27 - 31.2 pg   MCHC 31.4 (*) 31.8 - 35.4 g/dL   RDW, POC 04.5     Platelet Count, POC 303  142 - 424 K/uL   MPV 8.8  0 - 99.8 fL  POCT SEDIMENTATION RATE      Result Value Range   POCT SED RATE 3  0 - 22 mm/hr     Assessment and Plan: Chronic headaches - Plan: POCT CBC, Comprehensive metabolic panel, TSH, POCT SEDIMENTATION RATE, CT Head Wo Contrast  Right sided headache and eye pain for one month, feeling somewhat "off."  Romberg testing borderline. Discussed doing a  CT head today- she would like to proceed  Signed Abbe Amsterdam, MD  Received CT report and called pt: CT HEAD WITHOUT CONTRAST  Technique: Contiguous axial images were obtained from the base of the skull through the vertex without  contrast  Comparison: None  Findings: There is no evidence of brain mass, brain hemorrhage, or acute infarction.  The ventricular system is normal size and shape. There is no evidence of shift of midline structures, parenchymal lesion, or subdural or epidural hematoma.  The calvarium is intact. Mastoids are well aerated. No sinusitis is evident.  IMPRESSION: There is no evidence of brain mass, brain hemorrhage, or acute infarction.  No acute or active process is seen. No skull lesion is evident. No sinusitis is evident.   Called to discuss with her- as she does have eye pain we will have her see optho today Her eye doctor is Dr. Juanetta Gosling;  [facility], [place] Address: [address], [place], [place] [address] [phone]  Made her an appt at 2:20, she will attend.    Dr. Juanetta Gosling called me- he did not find any ocular pathology.  Normal pressure.  Called pt- will treat for migraine HA with fioricet.   Called into her pharmacy- she will let me know if not better in the next 1 or 2 days, Sooner if worse.   addnd 8/27- called to check on her.  LMOM- labs look fine, will send via mychart.  Let me know if HA not gone

## 2013-04-15 NOTE — Patient Instructions (Addendum)
You may go for your CT scan today at [facility] for a walk in CT scan, they are working you in now. You should go to the radiology department.  After your scan you can go home and I will call you with your results as soon as they come in.

## 2013-04-16 ENCOUNTER — Encounter: Payer: Self-pay | Admitting: Family Medicine

## 2013-06-26 ENCOUNTER — Other Ambulatory Visit: Payer: Self-pay

## 2013-12-22 ENCOUNTER — Other Ambulatory Visit: Payer: Self-pay | Admitting: Family Medicine

## 2014-01-06 ENCOUNTER — Ambulatory Visit (INDEPENDENT_AMBULATORY_CARE_PROVIDER_SITE_OTHER): Payer: 59 | Admitting: Emergency Medicine

## 2014-01-06 VITALS — BP 108/64 | HR 75 | Temp 98.0°F | Resp 16 | Ht 62.0 in | Wt 135.0 lb

## 2014-01-06 DIAGNOSIS — F411 Generalized anxiety disorder: Secondary | ICD-10-CM

## 2014-01-06 MED ORDER — PAROXETINE HCL 10 MG PO TABS: 10.0000 mg | ORAL_TABLET | Freq: Every day | ORAL | Status: DC

## 2014-01-06 NOTE — Progress Notes (Signed)
[facility] [address], [place] [id number] [phone]  Date:  01/06/2014   Name:  Danielle Banks   DOB:  1965-09-24   MRN:  [id number]  PCP:  No PCP Per Patient    Chief Complaint: Medication Refill   History of Present Illness:  Danielle Banks is a 48 y.o. very pleasant female patient who presents with the following:  History of anxiety and stable on paxil.  Tolerates medication and stable on on dose.  Sleeping and eating.  No improvement with over the counter medications or other home remedies. Denies other complaint or health concern today.   There are no active problems to display for this patient.   Past Medical History  Diagnosis Date  . Seasonal allergies   . Anemia   . Anxiety   . Blood transfusion abn reaction or complication, no procedure mishap     Past Surgical History  Procedure Laterality Date  . Appendectomy    . Tubal ligation    . Tumor removal      necrotic tumor removal    History  Substance Use Topics  . Smoking status: Never Smoker   . Smokeless tobacco: Never Used  . Alcohol Use: No    Family History  Problem Relation Age of Onset  . Hyperlipidemia Mother   . Osteoarthritis Mother   . Hypothyroidism Mother   . Heart failure Father   . Hypertension Father   . Heart disease Father   . Asthma Sister   . Asthma Daughter   . Hypertension Maternal Grandmother   . Stroke Maternal Grandmother   . Stroke Paternal Grandmother   . Pulmonary embolism Paternal Grandmother   . Early death Paternal Grandfather   . Heart disease Paternal Grandfather   . Stroke Maternal Grandfather     Allergies  Allergen Reactions  . Kiwi Extract Other (See Comments)    Lip tingling and numbness    Medication list has been reviewed and updated.  Current Outpatient Prescriptions on File Prior to Visit  Medication Sig Dispense Refill  . PARoxetine (PAXIL) 10 MG tablet Take 1 tablet (10 mg total) by mouth daily. PATIENT NEEDS OFFICE  VISIT FOR ADDITIONAL REFILLS  30 tablet  0  . butalbital-acetaminophen-caffeine (FIORICET) 50-325-40 MG per tablet Take 1 tablet by mouth every 6 (six) hours as needed for headache.  20 tablet  0  . Fe Fum-FePoly-FA-Vit C-Vit B3 (INTEGRA F) 125-1 MG CAPS Take 125 mg by mouth at bedtime as needed.  30 capsule  11  . fluticasone (FLONASE) 50 MCG/ACT nasal spray Place 2 sprays into the nose daily.  16 g  12   No current facility-administered medications on file prior to visit.    Review of Systems:  As per HPI, otherwise negative.    Physical Examination: Filed Vitals:   01/06/14 1341  BP: 108/64  Pulse: 75  Temp: 98 F (36.7 C)  Resp: 16   Filed Vitals:   01/06/14 1341  Height: 5\' 2"  (1.575 m)  Weight: 135 lb (61.236 kg)   Body mass index is 24.69 kg/(m^2). Ideal Body Weight: Weight in (lb) to have BMI = 25: 136.4   GEN: WDWN, NAD, Non-toxic, Alert & Oriented x 3 HEENT: Atraumatic, Normocephalic.  Ears and Nose: No external deformity. EXTR: No clubbing/cyanosis/edema NEURO: Normal gait.  PSYCH: Normally interactive. Conversant. Not depressed or anxious appearing.  Calm demeanor.    Assessment and Plan: Anxiety Refill paxil   Signed,  Phillips OdorJeffery Anderson,  MD

## 2014-01-06 NOTE — Patient Instructions (Signed)

## 2014-01-20 ENCOUNTER — Ambulatory Visit (INDEPENDENT_AMBULATORY_CARE_PROVIDER_SITE_OTHER): Payer: 59 | Admitting: Internal Medicine

## 2014-01-20 VITALS — BP 110/70 | HR 74 | Temp 98.3°F | Resp 16 | Ht 62.0 in | Wt 134.2 lb

## 2014-01-20 DIAGNOSIS — Z2839 Other underimmunization status: Secondary | ICD-10-CM

## 2014-01-20 DIAGNOSIS — Z9189 Other specified personal risk factors, not elsewhere classified: Secondary | ICD-10-CM

## 2014-01-20 DIAGNOSIS — Z23 Encounter for immunization: Secondary | ICD-10-CM

## 2014-01-20 NOTE — Progress Notes (Signed)
   Subjective:    Patient ID: Danielle Banks, female    DOB: 04-04-1966, 48 y.o.   MRN: [id number]  HPI This chart was scribed for Tonye Pearson, MD by Charline Bills, ED Scribe. The patient was seen in room 11. Patient's care was started at 6:10 PM.  HPI Comments: Danielle Banks is a 48 y.o. female who presents to the [facility] for t-dap injection. Pt needs an injection to return to school to obtain her Bachelors. Pt states that she has 8 more classes to take. Pt plans to return to [employer] this Fall.   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 She has worked their for 7 years.   Past Medical History  Diagnosis Date  . Seasonal allergies   . Anemia   . Anxiety   . Blood transfusion abn reaction or complication, no procedure mishap    Current Outpatient Prescriptions on File Prior to Visit  Medication Sig Dispense Refill  . PARoxetine (PAXIL) 10 MG tablet Take 1 tablet (10 mg total) by mouth daily. PATIENT NEEDS OFFICE VISIT FOR ADDITIONAL REFILLS  30 tablet  12   No current facility-administered medications on file prior to visit.   Allergies  Allergen Reactions  . Kiwi Extract Other (See Comments)    Lip tingling and numbness   Review of Systems  Constitutional: Negative for fever and unexpected weight change.       Objective:   Physical Exam  Nursing note and vitals reviewed. Constitutional: She is oriented to person, place, and time. She appears well-developed and well-nourished. No distress.  Cardiovascular: Normal rate.   Pulmonary/Chest: Effort normal. No respiratory distress.  Neurological: She is alert and oriented to person, place, and time.  Psychiatric: She has a normal mood and affect. Her behavior is normal.      Assessment & Plan:   I personally performed the services described in this documentation, which was scribed in my presence. The recorded information has been reviewed and is accurate.  Tdap update for continuing nursing school

## 2014-03-13 ENCOUNTER — Ambulatory Visit (INDEPENDENT_AMBULATORY_CARE_PROVIDER_SITE_OTHER): Payer: 59

## 2014-03-13 ENCOUNTER — Ambulatory Visit (INDEPENDENT_AMBULATORY_CARE_PROVIDER_SITE_OTHER): Payer: 59 | Admitting: Family Medicine

## 2014-03-13 VITALS — BP 118/76 | HR 77 | Temp 98.7°F | Resp 16 | Ht 62.75 in | Wt 132.0 lb

## 2014-03-13 DIAGNOSIS — R05 Cough: Secondary | ICD-10-CM

## 2014-03-13 DIAGNOSIS — R5381 Other malaise: Secondary | ICD-10-CM

## 2014-03-13 DIAGNOSIS — R5383 Other fatigue: Secondary | ICD-10-CM

## 2014-03-13 DIAGNOSIS — R059 Cough, unspecified: Secondary | ICD-10-CM

## 2014-03-13 LAB — POCT CBC
Granulocyte percent: 78.7 %G (ref 37–80)
HCT, POC: 35.7 % — AB (ref 37.7–47.9)
Hemoglobin: 11.5 g/dL — AB (ref 12.2–16.2)
LYMPH, POC: 1.7 (ref 0.6–3.4)
MCH: 26.5 pg — AB (ref 27–31.2)
MCHC: 32.1 g/dL (ref 31.8–35.4)
MCV: 82.4 fL (ref 80–97)
MID (CBC): 0.5 (ref 0–0.9)
MPV: 7.4 fL (ref 0–99.8)
PLATELET COUNT, POC: 293 10*3/uL (ref 142–424)
POC Granulocyte: 8.1 — AB (ref 2–6.9)
POC LYMPH %: 16.2 % (ref 10–50)
POC MID %: 5.1 % (ref 0–12)
RBC: 4.33 M/uL (ref 4.04–5.48)
RDW, POC: 13.3 %
WBC: 10.3 10*3/uL — AB (ref 4.6–10.2)

## 2014-03-13 LAB — POCT RAPID STREP A (OFFICE): Rapid Strep A Screen: NEGATIVE

## 2014-03-13 MED ORDER — CEFDINIR 300 MG PO CAPS: 300.0000 mg | ORAL_CAPSULE | Freq: Two times a day (BID) | ORAL | Status: DC

## 2014-03-13 MED ORDER — HYDROCODONE-HOMATROPINE 5-1.5 MG/5ML PO SYRP: 5.0000 mL | ORAL_SOLUTION | Freq: Three times a day (TID) | ORAL | Status: DC | PRN

## 2014-03-13 NOTE — Progress Notes (Signed)
[facility] [address], [place] [id number] [phone]  Date:  03/13/2014   Name:  Danielle Banks   DOB:  05/19/1966   MRN:  [id number]  PCP:  No PCP Per Patient    Chief Complaint: Sore Throat, Fatigue, Cough and Hoarse   History of Present Illness:  Danielle Banks is a 48 y.o. very pleasant female patient who presents with the following:  Here today with illness.  Generally healthy.  She is an ICU nurse at [employer]; she had to call out from work which "I haven't done in a year."   She has been ill for about 6 days- she has noted a cough and laryngitis, she has not noted a fever.  She has noted a ST and fatigue, and is coughing up "tan and yellow sputum."  She is having a hard time sleeping due to cough.    He BF has been ill recently as well- he is getting better on his own.   She is generally healthy NKDA  There are no active problems to display for this patient.   Past Medical History  Diagnosis Date  . Seasonal allergies   . Anemia   . Anxiety   . Blood transfusion abn reaction or complication, no procedure mishap     Past Surgical History  Procedure Laterality Date  . Appendectomy    . Tubal ligation    . Tumor removal      necrotic tumor removal    History  Substance Use Topics  . Smoking status: Never Smoker   . Smokeless tobacco: Never Used  . Alcohol Use: No    Family History  Problem Relation Age of Onset  . Hyperlipidemia Mother   . Osteoarthritis Mother   . Hypothyroidism Mother   . Heart failure Father   . Hypertension Father   . Heart disease Father   . Asthma Sister   . Asthma Daughter   . Hypertension Maternal Grandmother   . Stroke Maternal Grandmother   . Stroke Paternal Grandmother   . Pulmonary embolism Paternal Grandmother   . Early death Paternal Grandfather   . Heart disease Paternal Grandfather   . Stroke Maternal Grandfather     Allergies  Allergen Reactions  . Kiwi Extract Other (See Comments)    Lip  tingling and numbness    Medication list has been reviewed and updated.  Current Outpatient Prescriptions on File Prior to Visit  Medication Sig Dispense Refill  . PARoxetine (PAXIL) 10 MG tablet Take 1 tablet (10 mg total) by mouth daily. PATIENT NEEDS OFFICE VISIT FOR ADDITIONAL REFILLS  30 tablet  12   No current facility-administered medications on file prior to visit.    Review of Systems:  As per HPI- otherwise negative.   Physical Examination: Filed Vitals:   03/13/14 1054  BP: 118/76  Pulse: 77  Temp: 98.7 F (37.1 C)  Resp: 16   Filed Vitals:   03/13/14 1054  Height: 5' 2.75" (1.594 m)  Weight: 132 lb (59.875 kg)   Body mass index is 23.57 kg/(m^2). Ideal Body Weight: Weight in (lb) to have BMI = 25: 139.7  GEN: WDWN, NAD, Non-toxic, A & O x 3, cough in room HEENT: Atraumatic, Normocephalic. Neck supple. No masses, No LAD.  Bilateral TM wnl, oropharynx normal.  PEERL,EOMI.   Ears and Nose: No external deformity. CV: RRR, No M/G/R. No JVD. No thrill. No extra heart sounds. PULM: CTA B, no wheezes, crackles, rhonchi. No  retractions. No resp. distress. No accessory muscle use. EXTR: No c/c/e NEURO Normal gait.  PSYCH: Normally interactive. Conversant. Not depressed or anxious appearing.  Calm demeanor.   [facility] reading (PRIMARY) by  Dr. Patsy Lager. CXR:  Negative  CHEST 2 VIEW  COMPARISON: October 12, 2012.  FINDINGS: The heart size and mediastinal contours are within normal limits. Both lungs are clear. No pneumothorax or pleural effusion is noted. The visualized skeletal structures are unremarkable.  IMPRESSION: No acute cardiopulmonary abnormality seen.   Results for orders placed in visit on 03/13/14  POCT CBC      Result Value Ref Range   WBC 10.3 (*) 4.6 - 10.2 K/uL   Lymph, poc 1.7  0.6 - 3.4   POC LYMPH PERCENT 16.2  10 - 50 %L   MID (cbc) 0.5  0 - 0.9   POC MID % 5.1  0 - 12 %M   POC Granulocyte 8.1 (*) 2 - 6.9   Granulocyte percent 78.7   37 - 80 %G   RBC 4.33  4.04 - 5.48 M/uL   Hemoglobin 11.5 (*) 12.2 - 16.2 g/dL   HCT, POC 16.1 (*) 09.6 - 47.9 %   MCV 82.4  80 - 97 fL   MCH, POC 26.5 (*) 27 - 31.2 pg   MCHC 32.1  31.8 - 35.4 g/dL   RDW, POC 04.5     Platelet Count, POC 293  142 - 424 K/uL   MPV 7.4  0 - 99.8 fL  POCT RAPID STREP A (OFFICE)      Result Value Ref Range   Rapid Strep A Screen Negative  Negative    Assessment and Plan: Cough - Plan: POCT CBC, DG Chest 2 View, cefdinir (OMNICEF) 300 MG capsule, HYDROcodone-homatropine (HYCODAN) 5-1.5 MG/5ML syrup  Other malaise and fatigue - Plan: POCT rapid strep A  Treat for cough and bronchitis as above See patient instructions for more details.     Signed Abbe Amsterdam, MD

## 2014-03-13 NOTE — Patient Instructions (Signed)
We are going to treat you for bronchitis with omnicef, and hycodan as needed for cough.   Remember the hycodan will make you sleepy, so do not use it when you need to drive or work.    Please let me know if you do not feel better in the next couple of days- Sooner if worse.

## 2014-06-30 ENCOUNTER — Other Ambulatory Visit: Payer: Self-pay

## 2014-06-30 DIAGNOSIS — Z1231 Encounter for screening mammogram for malignant neoplasm of breast: Secondary | ICD-10-CM

## 2014-07-24 ENCOUNTER — Ambulatory Visit
Admission: RE | Admit: 2014-07-24 | Discharge: 2014-07-24 | Disposition: A | Discharge status: Home / Self Care | Payer: 59 | Source: Ambulatory Visit

## 2014-07-24 DIAGNOSIS — Z1231 Encounter for screening mammogram for malignant neoplasm of breast: Secondary | ICD-10-CM

## 2014-08-12 ENCOUNTER — Ambulatory Visit (INDEPENDENT_AMBULATORY_CARE_PROVIDER_SITE_OTHER): Payer: 59 | Admitting: Family Medicine

## 2014-08-12 VITALS — BP 118/68 | HR 84 | Temp 98.1°F | Resp 16 | Ht 62.5 in | Wt 133.8 lb

## 2014-08-12 DIAGNOSIS — Z862 Personal history of diseases of the blood and blood-forming organs and certain disorders involving the immune mechanism: Secondary | ICD-10-CM

## 2014-08-12 DIAGNOSIS — F329 Major depressive disorder, single episode, unspecified: Secondary | ICD-10-CM

## 2014-08-12 DIAGNOSIS — R5383 Other fatigue: Secondary | ICD-10-CM

## 2014-08-12 DIAGNOSIS — F32A Depression, unspecified: Secondary | ICD-10-CM

## 2014-08-12 DIAGNOSIS — F4323 Adjustment disorder with mixed anxiety and depressed mood: Secondary | ICD-10-CM

## 2014-08-12 LAB — POCT CBC
GRANULOCYTE PERCENT: 58 % (ref 37–80)
HCT, POC: 34.8 % — AB (ref 37.7–47.9)
HEMOGLOBIN: 10.9 g/dL — AB (ref 12.2–16.2)
LYMPH, POC: 2.1 (ref 0.6–3.4)
MCH, POC: 24.7 pg — AB (ref 27–31.2)
MCHC: 31.2 g/dL — AB (ref 31.8–35.4)
MCV: 79.2 fL — AB (ref 80–97)
MID (cbc): 0.3 (ref 0–0.9)
MPV: 7.2 fL (ref 0–99.8)
POC GRANULOCYTE: 3.3 (ref 2–6.9)
POC LYMPH PERCENT: 36.3 %L (ref 10–50)
POC MID %: 5.7 % (ref 0–12)
Platelet Count, POC: 364 10*3/uL (ref 142–424)
RBC: 4.39 M/uL (ref 4.04–5.48)
RDW, POC: 16.3 %
WBC: 5.7 10*3/uL (ref 4.6–10.2)

## 2014-08-12 LAB — COMPREHENSIVE METABOLIC PANEL
ALT: 11 U/L (ref 0–35)
AST: 15 U/L (ref 0–37)
Albumin: 4.4 g/dL (ref 3.5–5.2)
Alkaline Phosphatase: 56 U/L (ref 39–117)
BILIRUBIN TOTAL: 0.3 mg/dL (ref 0.2–1.2)
BUN: 9 mg/dL (ref 6–23)
CHLORIDE: 107 meq/L (ref 96–112)
CO2: 24 mEq/L (ref 19–32)
CREATININE: 0.56 mg/dL (ref 0.50–1.10)
Calcium: 9.3 mg/dL (ref 8.4–10.5)
Glucose, Bld: 77 mg/dL (ref 70–99)
Potassium: 4.3 mEq/L (ref 3.5–5.3)
SODIUM: 139 meq/L (ref 135–145)
TOTAL PROTEIN: 6.5 g/dL (ref 6.0–8.3)

## 2014-08-12 LAB — FERRITIN: Ferritin: 4 ng/mL — ABNORMAL LOW (ref 10–291)

## 2014-08-12 LAB — TSH: TSH: 2.563 u[IU]/mL (ref 0.350–4.500)

## 2014-08-12 MED ORDER — PAROXETINE HCL 20 MG PO TABS: ORAL_TABLET | ORAL | Status: DC

## 2014-08-12 NOTE — Patient Instructions (Addendum)
Increase the Paxil to 20 mg daily  Try to get regular rest  Get regular exercise  Return in 3-4 weeks if not doing much better  Return sooner at anytime if needed  Consider counsellor:  Some options: Nicole CellaClaude Ragan [phone] [facility] In[facility]hm [phone] Karmen BongoAaron Stewart [phone]

## 2014-08-12 NOTE — Progress Notes (Signed)
Subjective: Patient is here with depression. She says she feels like there is screaming in her head at times. She holds at all and has a good personality. There is a routine stress at work. She says Dr. Are mean and yell at the nurses all the time. She doesn't have a lot of confidants. She does talk to her husband some periods of marriages a new one, they've lived together for 4 years and then been married since September. She is under stress with his son. She says she knows she needs to get back into church. 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 She says she just cries too easily.  She has been having excessive fatigue. Does not get regular exercise any longer. Exercises at work.  Objective: A little tearful. We had a long talk. TMs are normal. Throat clear. Eyes PERRLA. EOMs intact. Neck supple without nodes. Chest clear. Heart regular without murmurs.  Assessment: Anxiety and depression Fatigue History of anemia  Plan: Check labs Increase her Paxil to 20 mg daily Return if needed Consider counseling

## 2014-08-13 ENCOUNTER — Telehealth: Payer: Self-pay | Admitting: Family Medicine

## 2014-08-13 NOTE — Telephone Encounter (Signed)
lmom to call back 

## 2015-04-01 ENCOUNTER — Encounter: Payer: 59 | Admitting: Family Medicine

## 2015-04-01 ENCOUNTER — Ambulatory Visit: Payer: 59

## 2015-08-04 NOTE — Progress Notes (Signed)
This encounter was created in error - please disregard.

## 2015-09-16 ENCOUNTER — Other Ambulatory Visit: Payer: Self-pay

## 2015-09-16 DIAGNOSIS — Z1231 Encounter for screening mammogram for malignant neoplasm of breast: Secondary | ICD-10-CM

## 2015-09-30 ENCOUNTER — Ambulatory Visit
Admission: RE | Admit: 2015-09-30 | Discharge: 2015-09-30 | Disposition: A | Discharge status: Home / Self Care | Payer: 59 | Source: Ambulatory Visit

## 2015-09-30 DIAGNOSIS — Z1231 Encounter for screening mammogram for malignant neoplasm of breast: Secondary | ICD-10-CM | POA: Diagnosis not present

## 2015-11-02 DIAGNOSIS — Z01419 Encounter for gynecological examination (general) (routine) without abnormal findings: Secondary | ICD-10-CM | POA: Diagnosis not present

## 2015-11-02 DIAGNOSIS — D500 Iron deficiency anemia secondary to blood loss (chronic): Secondary | ICD-10-CM | POA: Diagnosis not present

## 2015-11-02 DIAGNOSIS — N920 Excessive and frequent menstruation with regular cycle: Secondary | ICD-10-CM | POA: Diagnosis not present

## 2015-11-02 DIAGNOSIS — R5382 Chronic fatigue, unspecified: Secondary | ICD-10-CM | POA: Diagnosis not present

## 2015-11-10 MED FILL — CHANTIX STARTING MONTH BOX: 0.5 MG X 11 | 28 days supply | Qty: 53 | Fill #0

## 2015-11-30 DIAGNOSIS — J301 Allergic rhinitis due to pollen: Secondary | ICD-10-CM | POA: Diagnosis not present

## 2015-11-30 DIAGNOSIS — E559 Vitamin D deficiency, unspecified: Secondary | ICD-10-CM | POA: Diagnosis not present

## 2015-11-30 DIAGNOSIS — F411 Generalized anxiety disorder: Secondary | ICD-10-CM | POA: Diagnosis not present

## 2015-11-30 DIAGNOSIS — Z0000 Encounter for general adult medical examination without abnormal findings: Secondary | ICD-10-CM | POA: Diagnosis not present

## 2015-12-04 IMAGING — CR DG CHEST 2V
2 series · 2 of 2 positions shown · non-contrast
Comparison: October 12, 2012.

CLINICAL DATA: Cough.

EXAM:
CHEST  2 VIEW

[PA]
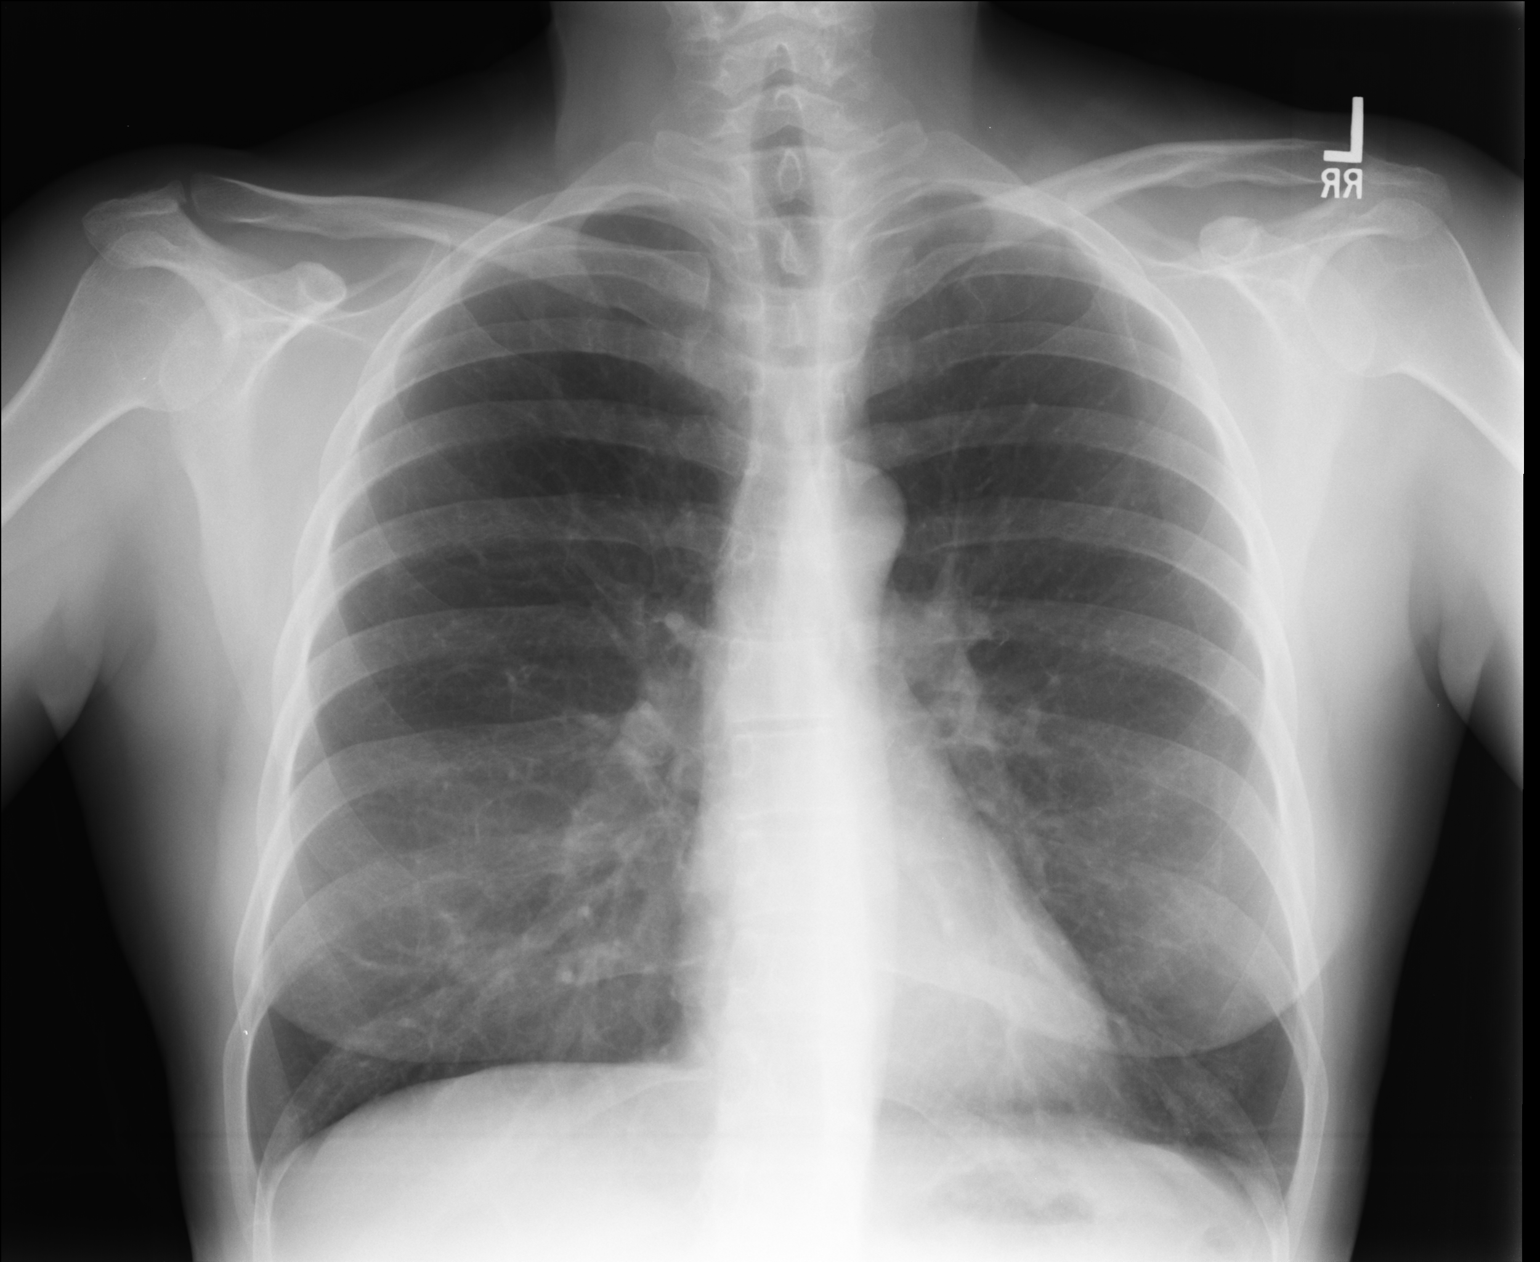

[lateral]
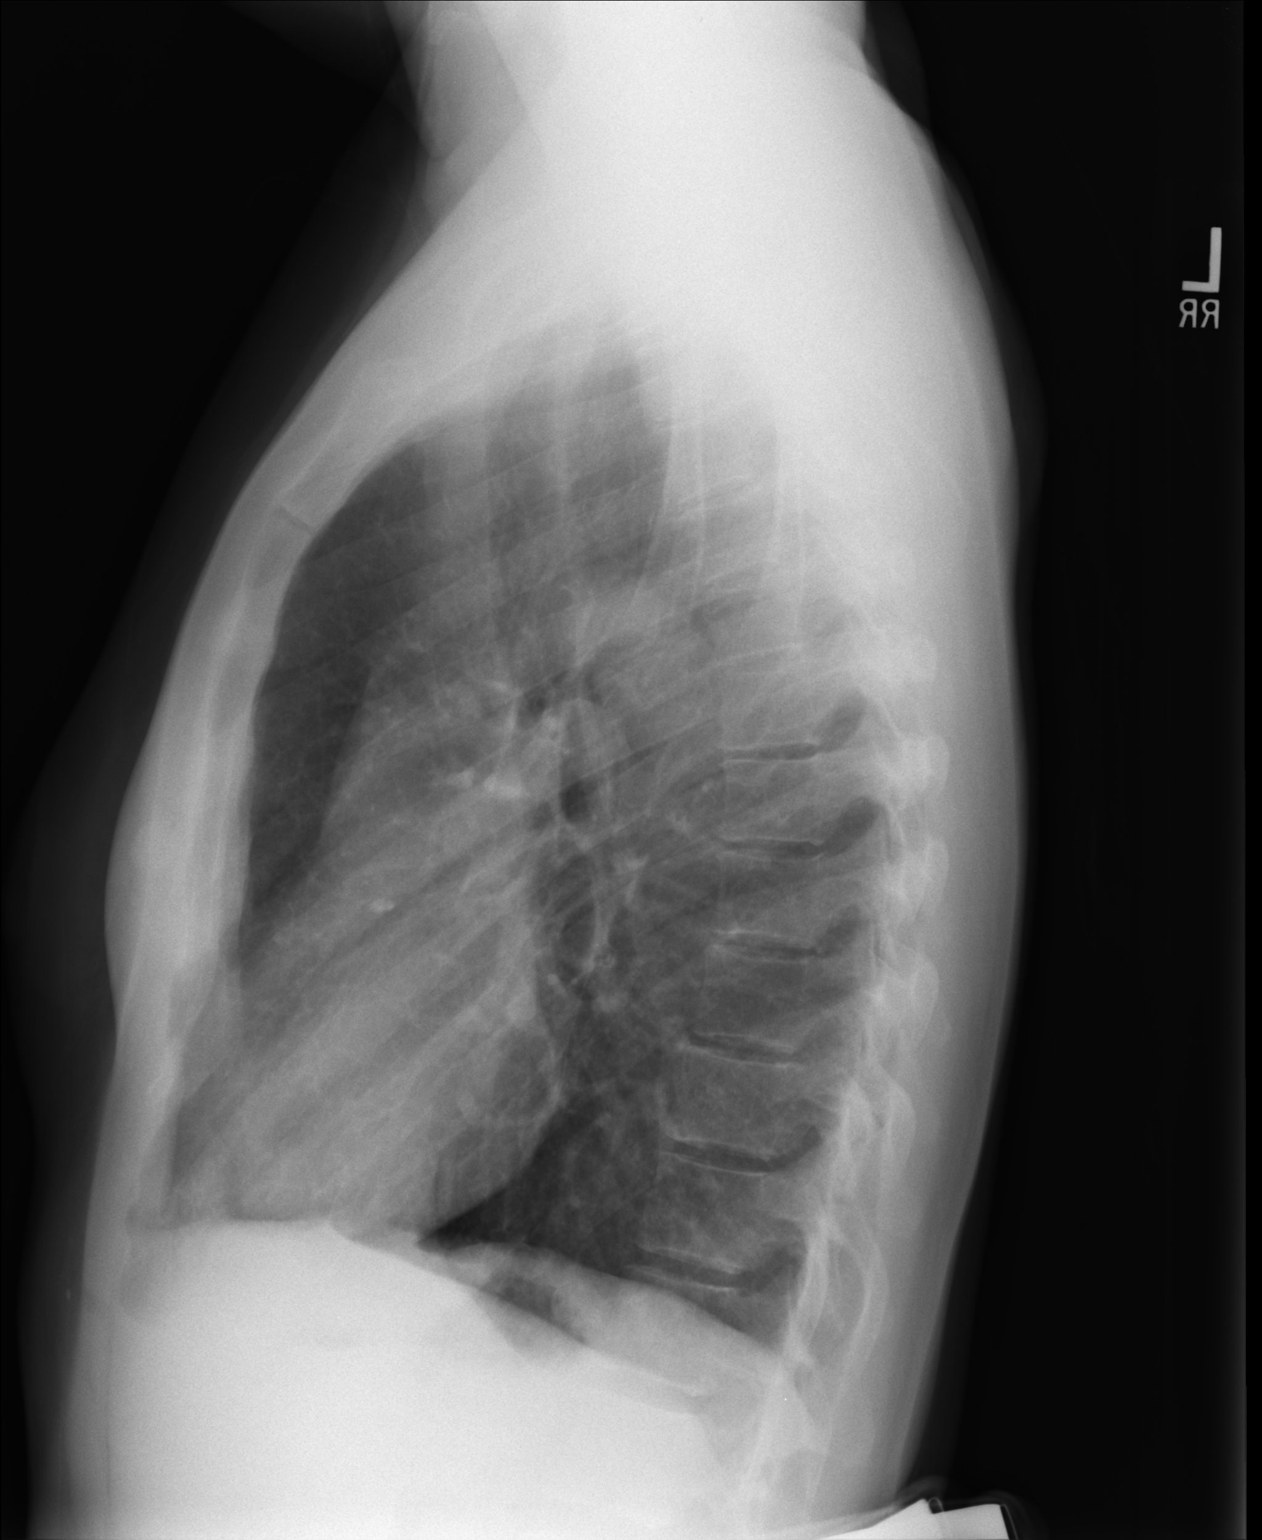

[2 of 2 positions shown; findings below may reference images not displayed]

FINDINGS: The heart size and mediastinal contours are within normal limits.
Both lungs are clear. No pneumothorax or pleural effusion is noted.
The visualized skeletal structures are unremarkable.
IMPRESSION: No acute cardiopulmonary abnormality seen.

## 2017-10-16 ENCOUNTER — Other Ambulatory Visit: Payer: Self-pay | Admitting: Oncology

## 2020-06-14 ENCOUNTER — Encounter: Payer: Self-pay | Admitting: Allergy and Immunology

## 2020-06-14 ENCOUNTER — Other Ambulatory Visit: Payer: Self-pay

## 2020-06-14 ENCOUNTER — Ambulatory Visit: Payer: Managed Care, Other (non HMO) | Admitting: Allergy and Immunology

## 2020-06-14 VITALS — BP 118/82 | HR 88 | Temp 98.9°F | Resp 20 | Ht 62.0 in | Wt 145.4 lb

## 2020-06-14 DIAGNOSIS — T7840XD Allergy, unspecified, subsequent encounter: Secondary | ICD-10-CM

## 2020-06-14 DIAGNOSIS — J310 Chronic rhinitis: Secondary | ICD-10-CM | POA: Diagnosis not present

## 2020-06-14 DIAGNOSIS — L500 Allergic urticaria: Secondary | ICD-10-CM | POA: Diagnosis not present

## 2020-06-14 MED ORDER — FAMOTIDINE 20 MG PO TABS: ORAL_TABLET | ORAL | 5 refills | Status: DC

## 2020-06-14 MED ORDER — AZELASTINE HCL 0.1 % NA SOLN: NASAL | 5 refills | Status: DC

## 2020-06-14 MED ORDER — LEVOCETIRIZINE DIHYDROCHLORIDE 5 MG PO TABS: ORAL_TABLET | ORAL | 5 refills | Status: DC

## 2020-06-14 NOTE — Progress Notes (Signed)
New Patient Note  RE: Danielle Banks MRN: [id number] DOB: [birth year] Date of Office Visit: 06/14/2020  Referring provider: Alma Friendly, MD Primary care provider: Patient, No Pcp Per  Chief Complaint: Urticaria   History of present illness: Danielle Banks is a 54 y.o. female seen today in consultation requested by Alma Friendly, MD.  Over the past 4 weeks, Danielle Banks has experienced recurrent episodes of hives. The hives have appeared at different times over her entire body, with the exception of her face.  The lesions are described as erythematous, raised, and pruritic and/or burning.  Individual hives seem to last more than 24 hours and some have left residual bruising.  She denies concomitant angioedema, cardiopulmonary symptoms and GI symptoms. She has not experienced unexpected weight loss, recurrent fevers or drenching night sweats.  She experienced similar hives last October in November.  Otherwise, no specific medication, food, skin care product, detergent, soap, or other environmental triggers have been identified. The symptoms do not seem to correlate with NSAIDs use or emotional stress. She did not have symptoms consistent with a respiratory tract infection at the time of symptom onset. Danielle Banks has tried to control symptoms with systemic steroids and antihistamines. She has been evaluated and treated in the emergency department for these symptoms. Skin biopsy has not been performed. Recently, she has been experiencing hives daily. Danielle Banks experiences sinus pressure, postnasal drainage, nasal pruritus, and ocular pruritus.  No significant seasonal symptom variation has been noted nor have specific environmental triggers been identified.  She attempts to control the symptoms with antihistamines and allergy eyedrops.  Assessment and plan: Recurrent urticaria Unclear etiology.  Due to the distribution of urticarial lesions over the patient's body and back, we were unable to skin test  today. NSAIDs commonly exacerbate urticaria but are not the underlying etiology in this case. Physical urticarias are negative by history (i.e. pressure-induced, temperature, vibration, solar, etc.).  There are no concomitant symptoms concerning for anaphylaxis. We will rule out other potential etiologies with labs. For symptom relief, patient is to take oral antihistamines as directed.  The following labs have been ordered: FCeRI antibody, anti-thyroglobulin antibody, thyroid peroxidase antibody, tryptase, CBC, CMP, ESR, ANA, and serum specific IgE against food panel, zone to environmental panel, and alpha-gal panel.  The patient will be called with further recommendations after lab results have returned.  Instructions have been discussed and provided for H1/H2 receptor blockade with titration to find lowest effective dose.  A prescription has been provided for levocetirizine (Xyzal), 5 mg daily as needed.  A prescription has been provided for famotidine (Pepcid), 20 mg twice daily as needed.  Should there be a significant increase or change in symptoms, a journal is to be kept recording any foods eaten, beverages consumed, medications taken within a 6 hour period prior to the onset of symptoms, as well as record activities being performed, and environmental conditions. For any symptoms concerning for anaphylaxis, 911 is to be called immediately.  Given the duration of some of the urticarial lesions as well as burning and/or bruising of some the lesions, I have recommended evaluation with biopsy of a fresh lesion per dermatology.  Chronic rhinitis  Serum specific IgE against zone to environmental panel has been ordered.  A prescription has been provided for azelastine nasal spray, 1-2 sprays per nostril 1-2 times daily as needed. Proper nasal spray technique has been discussed and demonstrated.   If needed, may add fluticasone (Flonase) 1 to 2 sprays per nostril daily as  needed.  Nasal  saline spray (i.e., Simply Saline) or nasal saline lavage (i.e., NeilMed) is recommended as needed and prior to medicated nasal sprays.  Levocetirizine (Xyzal) has been prescribed (as above).   Meds ordered this encounter  Medications  . famotidine (PEPCID) 20 MG tablet    Sig: Take one tablet twice daily as needed    Dispense:  60 tablet    Refill:  5  . levocetirizine (XYZAL) 5 MG tablet    Sig: Take one tablet daily as needed    Dispense:  30 tablet    Refill:  5  . azelastine (ASTELIN) 0.1 % nasal spray    Sig: 1-2 sprays per nostril 1-2 times daily as needed for runny nose    Dispense:  30 mL    Refill:  5    Diagnostics: Labs have been ordered.    Physical examination: Blood pressure 118/82, pulse 88, temperature 98.9 F (37.2 C), temperature source Tympanic, resp. rate 20, height 5' 2"  (1.575 m), weight 145 lb 6.4 oz (66 kg), SpO2 96 %.  General: Alert, interactive, in no acute distress. HEENT: TMs pearly gray, turbinates mildly edematous without discharge, post-pharynx mildly erythematous. Neck: Supple without lymphadenopathy. Lungs: Clear to auscultation without wheezing, rhonchi or rales. CV: Normal S1, S2 without murmurs. Abdomen: Nondistended, nontender. Skin: Warm and dry, without lesions or rashes. Extremities:  No clubbing, cyanosis or edema. Neuro:   Grossly intact.  Review of systems:  Review of systems negative except as noted in HPI / PMHx or noted below: Review of Systems  Constitutional: Negative.   HENT: Negative.   Eyes: Negative.   Respiratory: Negative.   Cardiovascular: Negative.   Gastrointestinal: Negative.   Genitourinary: Negative.   Musculoskeletal: Negative.   Skin: Negative.   Neurological: Negative.   Endo/Heme/Allergies: Negative.   Psychiatric/Behavioral: Negative.     Past medical history:  Past Medical History:  Diagnosis Date  . Anemia   . Anxiety   . Blood transfusion abn reaction or complication, no procedure mishap    . Seasonal allergies   . Urticaria     Past surgical history:  Past Surgical History:  Procedure Laterality Date  . APPENDECTOMY    . TUBAL LIGATION    . TUMOR REMOVAL     necrotic tumor removal    Family history: Family History  Problem Relation Age of Onset  . Hyperlipidemia Mother   . Osteoarthritis Mother   . Hypothyroidism Mother   . Heart failure Father   . Hypertension Father   . Heart disease Father   . Asthma Father   . Asthma Daughter   . Hypertension Maternal Grandmother   . Stroke Maternal Grandmother   . Stroke Paternal Grandmother   . Pulmonary embolism Paternal Grandmother   . Early death Paternal Grandfather   . Heart disease Paternal Grandfather   . Stroke Maternal Grandfather   . Allergic rhinitis Daughter   . Asthma Sister   . Angioedema Neg Hx     Social history: Social History   Socioeconomic History  . Marital status: Married    Spouse name: n/a  . Number of children: 3  . Years of education: College  . Highest education level: Not on file  Occupational History  . 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 Occupation: Programmer, multimedia: [employer]  Tobacco Use  . Smoking status: Current Every Day Smoker    Packs/day: 0.50  Years: 35.00    Pack years: 17.50    Types: Cigarettes  . Smokeless tobacco: Never Used  Vaping Use  . Vaping Use: Never used  Substance and Sexual Activity  . Alcohol use: No  . Drug use: No  . Sexual activity: Yes    Birth control/protection: Other-see comments, Surgical    Comment: Tubal ligation  Other Topics Concern  . Not on file  Social History Narrative   Lives with her daughters and her boyfriend and the boyfriend's son.  Her son lives with his father (the patient's ex-husband).   Social Determinants of Health   Financial Resource Strain:   . Difficulty of Paying Living Expenses: Not on file  Food Insecurity:   . Worried About Charity fundraiser in the Last  Year: Not on file  . Ran Out of Food in the Last Year: Not on file  Transportation Needs:   . Lack of Transportation (Medical): Not on file  . Lack of Transportation (Non-Medical): Not on file  Physical Activity:   . Days of Exercise per Week: Not on file  . Minutes of Exercise per Session: Not on file  Stress:   . Feeling of Stress : Not on file  Social Connections:   . Frequency of Communication with Friends and Family: Not on file  . Frequency of Social Gatherings with Friends and Family: Not on file  . Attends Religious Services: Not on file  . Active Member of Clubs or Organizations: Not on file  . Attends Archivist Meetings: Not on file  . Marital Status: Not on file  Intimate Partner Violence:   . Fear of Current or Ex-Partner: Not on file  . Emotionally Abused: Not on file  . Physically Abused: Not on file  . Sexually Abused: Not on file    Environmental History: The patient lives in a 53 year old house with laminate floors throughout and central air/heat.  There is no known mold/water damage in the home.  There are no pets in the home.  Current Outpatient Medications  Medication Sig Dispense Refill  . fluticasone (FLONASE) 50 MCG/ACT nasal spray 1 spray by Both Nostrils route daily.    Marland Kitchen PARoxetine (PAXIL) 20 MG tablet Take one daily for depression and anxiety 30 tablet 5  . azelastine (ASTELIN) 0.1 % nasal spray 1-2 sprays per nostril 1-2 times daily as needed for runny nose 30 mL 5  . famotidine (PEPCID) 20 MG tablet Take one tablet twice daily as needed 60 tablet 5  . hydrOXYzine (ATARAX/VISTARIL) 25 MG tablet Take 25 mg by mouth 3 (three) times daily. (Patient not taking: Reported on 06/14/2020)    . levocetirizine (XYZAL) 5 MG tablet Take one tablet daily as needed 30 tablet 5   No current facility-administered medications for this visit.    Known medication allergies: Allergies  Allergen Reactions  . Kiwi Extract Other (See Comments)    Lip tingling  and numbness    I appreciate the opportunity to take part in Danielle Banks's care. Please do not hesitate to contact me with questions.  Sincerely,   R. Edgar Frisk, MD

## 2020-06-14 NOTE — Assessment & Plan Note (Signed)
   Serum specific IgE against zone to environmental panel has been ordered.  A prescription has been provided for azelastine nasal spray, 1-2 sprays per nostril 1-2 times daily as needed. Proper nasal spray technique has been discussed and demonstrated.   If needed, may add fluticasone (Flonase) 1 to 2 sprays per nostril daily as needed.  Nasal saline spray (i.e., Simply Saline) or nasal saline lavage (i.e., NeilMed) is recommended as needed and prior to medicated nasal sprays.  Levocetirizine (Xyzal) has been prescribed (as above).

## 2020-06-14 NOTE — Assessment & Plan Note (Addendum)
Unclear etiology.  Due to the distribution of urticarial lesions over the patient's body and back, we were unable to skin test today. NSAIDs commonly exacerbate urticaria but are not the underlying etiology in this case. Physical urticarias are negative by history (i.e. pressure-induced, temperature, vibration, solar, etc.).  There are no concomitant symptoms concerning for anaphylaxis. We will rule out other potential etiologies with labs. For symptom relief, patient is to take oral antihistamines as directed.  The following labs have been ordered: FCeRI antibody, anti-thyroglobulin antibody, thyroid peroxidase antibody, tryptase, CBC, CMP, ESR, ANA, and serum specific IgE against food panel, zone to environmental panel, and alpha-gal panel.  The patient will be called with further recommendations after lab results have returned.  Instructions have been discussed and provided for H1/H2 receptor blockade with titration to find lowest effective dose.  A prescription has been provided for levocetirizine (Xyzal), 5 mg daily as needed.  A prescription has been provided for famotidine (Pepcid), 20 mg twice daily as needed.  Should there be a significant increase or change in symptoms, a journal is to be kept recording any foods eaten, beverages consumed, medications taken within a 6 hour period prior to the onset of symptoms, as well as record activities being performed, and environmental conditions. For any symptoms concerning for anaphylaxis, 911 is to be called immediately.  Given the duration of some of the urticarial lesions as well as burning and/or bruising of some the lesions, I have recommended evaluation with biopsy of a fresh lesion per dermatology.

## 2020-06-14 NOTE — Patient Instructions (Addendum)
Recurrent urticaria Unclear etiology.  Due to the distribution of urticarial lesions over the patient's body and back, we were unable to skin test today. NSAIDs commonly exacerbate urticaria but are not the underlying etiology in this case. Physical urticarias are negative by history (i.e. pressure-induced, temperature, vibration, solar, etc.).  There are no concomitant symptoms concerning for anaphylaxis. We will rule out other potential etiologies with labs. For symptom relief, patient is to take oral antihistamines as directed.  The following labs have been ordered: FCeRI antibody, anti-thyroglobulin antibody, thyroid peroxidase antibody, tryptase, CBC, CMP, ESR, ANA, and serum specific IgE against food panel, zone to environmental panel, and alpha-gal panel.  The patient will be called with further recommendations after lab results have returned.  Instructions have been discussed and provided for H1/H2 receptor blockade with titration to find lowest effective dose.  A prescription has been provided for levocetirizine (Xyzal), 5 mg daily as needed.  A prescription has been provided for famotidine (Pepcid), 20 mg twice daily as needed.  Should there be a significant increase or change in symptoms, a journal is to be kept recording any foods eaten, beverages consumed, medications taken within a 6 hour period prior to the onset of symptoms, as well as record activities being performed, and environmental conditions. For any symptoms concerning for anaphylaxis, 911 is to be called immediately.  Given the duration of some of the urticarial lesions as well as burning and/or bruising of some the lesions, I have recommended evaluation with biopsy of a fresh lesion per dermatology.  Chronic rhinitis  Serum specific IgE against zone to environmental panel has been ordered.  A prescription has been provided for azelastine nasal spray, 1-2 sprays per nostril 1-2 times daily as needed. Proper nasal spray  technique has been discussed and demonstrated.   If needed, may add fluticasone (Flonase) 1 to 2 sprays per nostril daily as needed.  Nasal saline spray (i.e., Simply Saline) or nasal saline lavage (i.e., NeilMed) is recommended as needed and prior to medicated nasal sprays.  Levocetirizine (Xyzal) has been prescribed (as above).   When lab results have returned you will be called with further recommendations. With the newly implemented Cures Act, the labs may be visible to you at the same time they become visible to [place]. However, the results will typically not be addressed until all of the results are back, so please be patient.  Until you have heard from [place], please continue the treatment plan as outlined on your take home sheet.  Urticaria (Hives)  . Levocetirizine (Xyzal) 5 mg twice a day and famotidine (Pepcid) 20 mg twice a day. If no symptoms for 7-14 days then decrease to. . Levocetirizine (Xyzal) 5 mg twice a day and famotidine (Pepcid) 20 mg once a day.  If no symptoms for 7-14 days then decrease to. . Levocetirizine (Xyzal) 5 mg twice a day.  If no symptoms for 7-14 days then decrease to. . Levocetirizine (Xyzal) 5 mg once a day.  May use Benadryl (diphenhydramine) as needed for breakthrough symptoms       If symptoms return, then step up dosage

## 2020-06-15 LAB — THYROID PEROXIDASE ANTIBODY: Thyroperoxidase Ab SerPl-aCnc: <8 IU/mL (ref 0–34)

## 2020-06-15 LAB — ALPHA-GAL PANEL

## 2020-06-15 LAB — ALLERGENS, ZONE 2

## 2020-06-15 LAB — CBC WITH DIFFERENTIAL/PLATELET
Hematocrit: 44.2 % (ref 34.0–46.6)
Monocytes Absolute: 0.8 10*3/uL (ref 0.1–0.9)
RBC: 4.81 x10E6/uL (ref 3.77–5.28)
WBC: 9.3 10*3/uL (ref 3.4–10.8)

## 2020-06-15 LAB — FOOD ALLERGY PROFILE

## 2020-06-15 LAB — CHRONIC URTICARIA

## 2020-06-15 LAB — SEDIMENTATION RATE: Sed Rate: 4 mm/hr (ref 0–40)

## 2020-06-16 LAB — ALLERGENS, ZONE 2
Alternaria Alternata IgE: <0.1 kU/L
Amer Sycamore IgE Qn: <0.1 kU/L
Bahia Grass IgE: <0.1 kU/L
Bermuda Grass IgE: <0.1 kU/L
Cat Dander IgE: <0.1 kU/L
Cedar, Mountain IgE: <0.1 kU/L
Cladosporium Herbarum IgE: <0.1 kU/L
Cockroach, American IgE: <0.1 kU/L
Common Silver Birch IgE: <0.1 kU/L
D Farinae IgE: <0.1 kU/L
D Pteronyssinus IgE: <0.1 kU/L
Dog Dander IgE: <0.1 kU/L
Hickory, White IgE: <0.1 kU/L
Johnson Grass IgE: <0.1 kU/L
Maple/Box Elder IgE: <0.1 kU/L
Mucor Racemosus IgE: <0.1 kU/L
Mugwort IgE Qn: <0.1 kU/L
Nettle IgE: <0.1 kU/L
Oak, White IgE: <0.1 kU/L
Penicillium Chrysogen IgE: <0.1 kU/L
Pigweed, Rough IgE: <0.1 kU/L
Ragweed, Short IgE: <0.1 kU/L
Sheep Sorrel IgE Qn: <0.1 kU/L
Stemphylium Herbarum IgE: <0.1 kU/L
Sweet gum IgE RAST Ql: <0.1 kU/L
Timothy Grass IgE: <0.1 kU/L
White Mulberry IgE: <0.1 kU/L

## 2020-06-16 LAB — CBC WITH DIFFERENTIAL/PLATELET
Basophils Absolute: 0.1 10*3/uL (ref 0.0–0.2)
Basos: 1 %
EOS (ABSOLUTE): 0.1 10*3/uL (ref 0.0–0.4)
Eos: 1 %
Hemoglobin: 14.9 g/dL (ref 11.1–15.9)
Immature Grans (Abs): 0 10*3/uL (ref 0.0–0.1)
Immature Granulocytes: 0 %
Lymphocytes Absolute: 1.5 10*3/uL (ref 0.7–3.1)
Lymphs: 16 %
MCH: 31 pg (ref 26.6–33.0)
MCHC: 33.7 g/dL (ref 31.5–35.7)
MCV: 92 fL (ref 79–97)
Monocytes: 8 %
Neutrophils Absolute: 6.8 10*3/uL (ref 1.4–7.0)
Neutrophils: 74 %
Platelets: 302 10*3/uL (ref 150–450)
RDW: 12.7 % (ref 11.7–15.4)

## 2020-06-16 LAB — FOOD ALLERGY PROFILE
Allergen Corn, IgE: <0.1 kU/L
Clam IgE: <0.1 kU/L
Codfish IgE: <0.1 kU/L
Egg White IgE: <0.1 kU/L
Milk IgE: <0.1 kU/L
Peanut IgE: <0.1 kU/L
Scallop IgE: <0.1 kU/L
Sesame Seed IgE: <0.1 kU/L
Shrimp IgE: <0.1 kU/L
Soybean IgE: <0.1 kU/L
Wheat IgE: <0.1 kU/L

## 2020-06-16 LAB — COMPREHENSIVE METABOLIC PANEL
Chloride: 105 mmol/L (ref 96–106)
Globulin, Total: 2 g/dL (ref 1.5–4.5)
Total Protein: 6.8 g/dL (ref 6.0–8.5)

## 2020-06-16 LAB — THYROGLOBULIN ANTIBODY: Thyroglobulin Antibody: <1 IU/mL (ref 0.0–0.9)

## 2020-06-16 LAB — TRYPTASE: Tryptase: 2.8 ug/L (ref 2.2–13.2)

## 2020-06-25 LAB — COMPREHENSIVE METABOLIC PANEL
ALT: 42 IU/L — ABNORMAL HIGH (ref 0–32)
AST: 32 IU/L (ref 0–40)
Albumin/Globulin Ratio: 2.4 — ABNORMAL HIGH (ref 1.2–2.2)
Albumin: 4.8 g/dL (ref 3.8–4.9)
Alkaline Phosphatase: 86 IU/L (ref 44–121)
BUN/Creatinine Ratio: 15 (ref 9–23)
BUN: 7 mg/dL (ref 6–24)
Bilirubin Total: 0.2 mg/dL (ref 0.0–1.2)
CO2: 23 mmol/L (ref 20–29)
Calcium: 9.6 mg/dL (ref 8.7–10.2)
Creatinine, Ser: 0.48 mg/dL — ABNORMAL LOW (ref 0.57–1.00)
GFR calc Af Amer: 130 mL/min/{1.73_m2} (ref >59)
GFR calc non Af Amer: 112 mL/min/{1.73_m2} (ref >59)
Glucose: 89 mg/dL (ref 65–99)
Potassium: 4.2 mmol/L (ref 3.5–5.2)
Sodium: 141 mmol/L (ref 134–144)

## 2020-06-25 LAB — ALPHA-GAL PANEL
Alpha Gal IgE*: <0.1 kU/L (ref <0.10)
Beef (Bos spp) IgE: <0.1 kU/L (ref <0.35)
Class Interpretation: 0
Lamb/Mutton (Ovis spp) IgE: <0.1 kU/L (ref <0.35)
Pork (Sus spp) IgE: <0.1 kU/L (ref <0.35)

## 2020-06-25 LAB — ANA W/REFLEX IF POSITIVE: Anti Nuclear Antibody (ANA): NEGATIVE

## 2021-06-27 ENCOUNTER — Other Ambulatory Visit: Payer: Self-pay

## 2021-06-28 ENCOUNTER — Other Ambulatory Visit: Payer: Self-pay | Admitting: *Deleted

## 2021-06-28 NOTE — Telephone Encounter (Signed)
Received fax for azelastine refill. Pt last seen 05/2020. Needs ov. Faxed denial to pharmacy.

## 2021-07-05 ENCOUNTER — Other Ambulatory Visit: Payer: Self-pay

## 2022-08-02 ENCOUNTER — Other Ambulatory Visit (HOSPITAL_COMMUNITY): Payer: Self-pay

## 2022-08-03 ENCOUNTER — Other Ambulatory Visit (HOSPITAL_COMMUNITY): Payer: Self-pay

## 2022-08-03 MED ORDER — PAROXETINE HCL 10 MG PO TABS
10.0000 mg | ORAL_TABLET | Freq: Every day | ORAL | 0 refills | Status: DC
  Filled 2022-08-03 – 2022-08-24 (×2): qty 90, 90d supply, fill #0

## 2022-08-04 ENCOUNTER — Other Ambulatory Visit (HOSPITAL_COMMUNITY): Payer: Self-pay

## 2022-08-15 ENCOUNTER — Other Ambulatory Visit (HOSPITAL_COMMUNITY): Payer: Self-pay

## 2022-08-24 ENCOUNTER — Other Ambulatory Visit (HOSPITAL_COMMUNITY): Payer: Self-pay

## 2022-10-06 ENCOUNTER — Other Ambulatory Visit (HOSPITAL_COMMUNITY): Payer: Self-pay

## 2022-10-06 MED ORDER — PAROXETINE HCL 20 MG PO TABS
20.0000 mg | ORAL_TABLET | Freq: Every day | ORAL | 1 refills | Status: DC
  Filled 2022-10-06: qty 90, 90d supply, fill #0
  Filled 2023-01-23: qty 90, 90d supply, fill #1

## 2022-10-10 ENCOUNTER — Other Ambulatory Visit (HOSPITAL_COMMUNITY): Payer: Self-pay

## 2022-10-10 MED ORDER — DOXYCYCLINE HYCLATE 100 MG PO CAPS
100.0000 mg | ORAL_CAPSULE | Freq: Two times a day (BID) | ORAL | 0 refills | Status: DC
  Filled 2022-10-10: qty 14, 7d supply, fill #0

## 2022-10-25 DIAGNOSIS — Z0000 Encounter for general adult medical examination without abnormal findings: Secondary | ICD-10-CM | POA: Diagnosis not present

## 2022-11-08 DIAGNOSIS — Z87891 Personal history of nicotine dependence: Secondary | ICD-10-CM | POA: Diagnosis not present

## 2022-11-08 DIAGNOSIS — Z122 Encounter for screening for malignant neoplasm of respiratory organs: Secondary | ICD-10-CM | POA: Diagnosis not present

## 2022-12-11 DIAGNOSIS — H18523 Epithelial (juvenile) corneal dystrophy, bilateral: Secondary | ICD-10-CM | POA: Diagnosis not present

## 2022-12-11 DIAGNOSIS — H04123 Dry eye syndrome of bilateral lacrimal glands: Secondary | ICD-10-CM | POA: Diagnosis not present

## 2022-12-11 DIAGNOSIS — H18832 Recurrent erosion of cornea, left eye: Secondary | ICD-10-CM | POA: Diagnosis not present

## 2023-01-23 ENCOUNTER — Other Ambulatory Visit (HOSPITAL_COMMUNITY): Payer: Self-pay

## 2023-02-05 ENCOUNTER — Other Ambulatory Visit (HOSPITAL_COMMUNITY): Payer: Self-pay

## 2023-02-24 DIAGNOSIS — J209 Acute bronchitis, unspecified: Secondary | ICD-10-CM | POA: Diagnosis not present

## 2023-04-03 DIAGNOSIS — J439 Emphysema, unspecified: Secondary | ICD-10-CM | POA: Diagnosis not present

## 2023-04-03 DIAGNOSIS — E559 Vitamin D deficiency, unspecified: Secondary | ICD-10-CM | POA: Diagnosis not present

## 2023-04-03 DIAGNOSIS — F411 Generalized anxiety disorder: Secondary | ICD-10-CM | POA: Diagnosis not present

## 2023-04-03 DIAGNOSIS — R3129 Other microscopic hematuria: Secondary | ICD-10-CM | POA: Diagnosis not present

## 2023-05-02 DIAGNOSIS — R3129 Other microscopic hematuria: Secondary | ICD-10-CM | POA: Diagnosis not present

## 2023-06-13 ENCOUNTER — Encounter: Payer: Self-pay | Admitting: Pharmacist

## 2023-06-13 ENCOUNTER — Other Ambulatory Visit: Payer: Self-pay

## 2023-06-13 ENCOUNTER — Other Ambulatory Visit (HOSPITAL_COMMUNITY): Payer: Self-pay

## 2023-06-13 MED ORDER — PAROXETINE HCL 20 MG PO TABS
20.0000 mg | ORAL_TABLET | Freq: Every day | ORAL | 1 refills | Status: DC
  Filled 2023-06-13 – 2023-09-13 (×3): qty 90, 90d supply, fill #0

## 2023-06-15 ENCOUNTER — Other Ambulatory Visit: Payer: Self-pay | Admitting: Internal Medicine

## 2023-06-15 ENCOUNTER — Other Ambulatory Visit: Payer: Self-pay | Admitting: Primary Care

## 2023-06-15 DIAGNOSIS — Z1231 Encounter for screening mammogram for malignant neoplasm of breast: Secondary | ICD-10-CM

## 2023-06-18 ENCOUNTER — Other Ambulatory Visit: Payer: Self-pay

## 2023-06-28 ENCOUNTER — Other Ambulatory Visit (INDEPENDENT_AMBULATORY_CARE_PROVIDER_SITE_OTHER): Payer: Commercial Managed Care - PPO

## 2023-06-28 ENCOUNTER — Other Ambulatory Visit: Payer: Self-pay

## 2023-06-28 ENCOUNTER — Encounter: Payer: Self-pay | Admitting: Pharmacist

## 2023-06-28 ENCOUNTER — Ambulatory Visit: Payer: Commercial Managed Care - PPO | Admitting: Orthopedic Surgery

## 2023-06-28 ENCOUNTER — Other Ambulatory Visit (HOSPITAL_COMMUNITY): Payer: Self-pay

## 2023-06-28 DIAGNOSIS — M25531 Pain in right wrist: Secondary | ICD-10-CM

## 2023-07-02 ENCOUNTER — Encounter: Payer: Self-pay | Admitting: Orthopedic Surgery

## 2023-07-02 NOTE — Progress Notes (Signed)
Office Visit Note   Patient: Danielle Banks           Date of Birth: [birth year]           MRN: [id number] Visit Date: 06/28/2023              Requested by: No referring provider defined for this encounter. PCP: Patient, No Pcp Per  Chief Complaint  Patient presents with   Right Hand - Pain      HPI: Patient is a 57 year old woman is seen for initial evaluation for right wrist pain.  Patient states that the injury was misdiagnosed she was treated in a cast for a wrist fracture without treatment of the scaphoid injury.  Patient is status post bilateral carpal tunnel releases.  Patient states there is an MRI scan that was obtained at [facility]  Assessment & Plan: Visit Diagnoses:  1. Pain in right wrist     Plan: Recommended follow-up with Dr. Allyne Gee for evaluation for the scapholunate collapse and chronic scaphoid fracture.  Follow-Up Instructions: No follow-ups on file.   Ortho Exam  Patient is alert, oriented, no adenopathy, well-dressed, normal affect, normal respiratory effort. Examination patient has a palpable pulse she has full supination and pronation.  She does have some tenderness to palpation of the first dorsal extensor compartment.  She does have some tenderness to palpation over the scapholunate interval.  She has weakness with grip strength.  Imaging: No results found. No images are attached to the encounter.  Labs: Lab Results  Component Value Date   HGBA1C 5.2 12/07/2012   ESRSEDRATE 4 06/14/2020   REPTSTATUS 11/22/2008 FINAL 11/20/2008   CULT NO GROWTH 11/20/2008   LABORGA Insignificant Growth 12/07/2012     Lab Results  Component Value Date   ALBUMIN 4.8 06/14/2020   ALBUMIN 4.4 08/12/2014   ALBUMIN 4.4 04/15/2013    No results found for: "MG" No results found for: "VD25OH"  No results found for: "PREALBUMIN"    Latest Ref Rng & Units 06/14/2020   10:53 AM 08/12/2014   11:45 AM 03/13/2014   12:05 PM  CBC EXTENDED  WBC 3.4 - 10.8  x10E3/uL 9.3  5.7  10.3   RBC 3.77 - 5.28 x10E6/uL 4.81  4.39  4.33   Hemoglobin 11.1 - 15.9 g/dL 13.2  44.0  10.2   HCT 34.0 - 46.6 % 44.2  34.8  35.7   Platelets 150 - 450 x10E3/uL 302     NEUT# 1.4 - 7.0 x10E3/uL 6.8     Lymph# 0.7 - 3.1 x10E3/uL 1.5        There is no height or weight on file to calculate BMI.  Orders:  Orders Placed This Encounter  Procedures   XR Wrist Complete Right   No orders of the defined types were placed in this encounter.    Procedures: No procedures performed  Clinical Data: No additional findings.  ROS:  All other systems negative, except as noted in the HPI. Review of Systems  Objective: Vital Signs: There were no vitals taken for this visit.  Specialty Comments:  No specialty comments available.  PMFS History: Patient Active Problem List   Diagnosis Date Noted   Recurrent urticaria 06/14/2020   Chronic rhinitis 06/14/2020   Past Medical History:  Diagnosis Date   Anemia    Anxiety    Blood transfusion abn reaction or complication, no procedure mishap    Seasonal allergies    Urticaria     Family History  Problem Relation Age of Onset   Hyperlipidemia Mother    Osteoarthritis Mother    Hypothyroidism Mother    Heart failure Father    Hypertension Father    Heart disease Father    Asthma Father    Asthma Daughter    Hypertension Maternal Grandmother    Stroke Maternal Grandmother    Stroke Paternal Grandmother    Pulmonary embolism Paternal Grandmother    Early death Paternal Grandfather    Heart disease Paternal Grandfather    Stroke Maternal Grandfather    Allergic rhinitis Daughter    Asthma Sister    Angioedema Neg Hx     Past Surgical History:  Procedure Laterality Date   APPENDECTOMY     TUBAL LIGATION     TUMOR REMOVAL     necrotic tumor removal   Social History   Occupational History   Occupation: Academic librarian: [employer]    Comment: Lawyer, ICU   Occupation: Teacher, adult education: [employer]  Tobacco Use   Smoking status: Every Day    Current packs/day: 0.50    Average packs/day: 0.5 packs/day for 35.0 years (17.5 ttl pk-yrs)    Types: Cigarettes   Smokeless tobacco: Never  Vaping Use   Vaping status: Never Used  Substance and Sexual Activity   Alcohol use: No   Drug use: No   Sexual activity: Yes    Birth control/protection: Other-see comments, Surgical    Comment: Tubal ligation

## 2023-07-03 ENCOUNTER — Other Ambulatory Visit: Payer: Self-pay

## 2023-07-04 ENCOUNTER — Other Ambulatory Visit: Payer: Self-pay

## 2023-07-05 DIAGNOSIS — Z1231 Encounter for screening mammogram for malignant neoplasm of breast: Secondary | ICD-10-CM

## 2023-08-01 ENCOUNTER — Ambulatory Visit: Payer: Commercial Managed Care - PPO | Admitting: Orthopedic Surgery

## 2023-08-17 ENCOUNTER — Telehealth: Payer: Commercial Managed Care - PPO | Admitting: Family Medicine

## 2023-08-17 DIAGNOSIS — J019 Acute sinusitis, unspecified: Secondary | ICD-10-CM

## 2023-08-17 DIAGNOSIS — B9689 Other specified bacterial agents as the cause of diseases classified elsewhere: Secondary | ICD-10-CM

## 2023-08-17 MED ORDER — AMOXICILLIN-POT CLAVULANATE 875-125 MG PO TABS: 1.0000 | ORAL_TABLET | Freq: Two times a day (BID) | ORAL | 0 refills | Status: DC

## 2023-08-17 NOTE — Patient Instructions (Signed)

## 2023-08-17 NOTE — Progress Notes (Signed)
Virtual Visit Consent   Danielle Banks, you are scheduled for a virtual visit with a [facility] provider today. Just as with appointments in the office, your consent must be obtained to participate. Your consent will be active for this visit and any virtual visit you may have with one of our providers in the next 365 days. If you have a MyChart account, a copy of this consent can be sent to you electronically.  As this is a virtual visit, video technology does not allow for your provider to perform a traditional examination. This may limit your provider's ability to fully assess your condition. If your provider identifies any concerns that need to be evaluated in person or the need to arrange testing (such as labs, EKG, etc.), we will make arrangements to do so. Although advances in technology are sophisticated, we cannot ensure that it will always work on either your end or our end. If the connection with a video visit is poor, the visit may have to be switched to a telephone visit. With either a video or telephone visit, we are not always able to ensure that we have a secure connection.  By engaging in this virtual visit, you consent to the provision of healthcare and authorize for your insurance to be billed (if applicable) for the services provided during this visit. Depending on your insurance coverage, you may receive a charge related to this service.  I need to obtain your verbal consent now. Are you willing to proceed with your visit today? NYASHIA Banks has provided verbal consent on 08/17/2023 for a virtual visit (video or telephone). Georgana Curio, FNP  Date: 08/17/2023 6:35 PM  Virtual Visit via Video Note   I, Georgana Curio, connected with  Danielle Banks  ([id number], Mar 12, 1966) on 08/17/23 at  6:30 PM EST by a video-enabled telemedicine application and verified that I am speaking with the correct person using two identifiers.  Location: Patient: Virtual Visit Location Patient:  Home Provider: Virtual Visit Location Provider: Home Office   I discussed the limitations of evaluation and management by telemedicine and the availability of in person appointments. The patient expressed understanding and agreed to proceed.    History of Present Illness: Danielle Banks is a 57 y.o. who identifies as a female who was assigned female at birth, and is being seen today for sinus pressure and old sx for 10 days with redness above eye mild and small amt of fluid along left jawline minimal.No vision issues and no fever.   HPI: HPI  Problems:  Patient Active Problem List   Diagnosis Date Noted   Recurrent urticaria 06/14/2020   Chronic rhinitis 06/14/2020    Allergies:  Allergies  Allergen Reactions   Kiwi Extract Other (See Comments)    Lip tingling and numbness   Medications:  Current Outpatient Medications:    azelastine (ASTELIN) 0.1 % nasal spray, 1-2 sprays per nostril 1-2 times daily as needed for runny nose, Disp: 30 mL, Rfl: 5   doxycycline (VIBRAMYCIN) 100 MG capsule, Take 1 capsule (100 mg total) by mouth 2 (two) times daily for 7 days, Disp: 14 capsule, Rfl: 0   famotidine (PEPCID) 20 MG tablet, Take one tablet twice daily as needed, Disp: 60 tablet, Rfl: 5   fluticasone (FLONASE) 50 MCG/ACT nasal spray, 1 spray by Both Nostrils route daily., Disp: , Rfl:    hydrOXYzine (ATARAX/VISTARIL) 25 MG tablet, Take 25 mg by mouth 3 (three) times daily. (Patient not taking: Reported  on 06/14/2020), Disp: , Rfl:    levocetirizine (XYZAL) 5 MG tablet, Take one tablet daily as needed, Disp: 30 tablet, Rfl: 5   PARoxetine (PAXIL) 10 MG tablet, Take 1 tablet (10 mg total) by mouth daily., Disp: 90 tablet, Rfl: 0   PARoxetine (PAXIL) 20 MG tablet, Take one daily for depression and anxiety, Disp: 30 tablet, Rfl: 5   PARoxetine (PAXIL) 20 MG tablet, Take 1 tablet (20 mg total) by mouth daily., Disp: 90 tablet, Rfl: 1  Observations/Objective: Patient is well-developed,  well-nourished in no acute distress.  Resting comfortably  at home.  Head is normocephalic, atraumatic.  No labored breathing.  Speech is clear and coherent with logical content.  Patient is alert and oriented at baseline.   Assessment and Plan: 1. Acute bacterial sinusitis (Primary)  Increase fluids, warm compresses to face, ibuprofen or tylenol as directed, UC if sx worsen.   Follow Up Instructions: I discussed the assessment and treatment plan with the patient. The patient was provided an opportunity to ask questions and all were answered. The patient agreed with the plan and demonstrated an understanding of the instructions.  A copy of instructions were sent to the patient via MyChart unless otherwise noted below.     The patient was advised to call back or seek an in-person evaluation if the symptoms worsen or if the condition fails to improve as anticipated.    Georgana Curio, FNP

## 2023-08-24 ENCOUNTER — Ambulatory Visit
Admission: RE | Admit: 2023-08-24 | Discharge: 2023-08-24 | Disposition: A | Discharge status: Home / Self Care | Payer: Commercial Managed Care - PPO | Source: Ambulatory Visit | Attending: Primary Care | Admitting: Primary Care

## 2023-08-24 DIAGNOSIS — Z1231 Encounter for screening mammogram for malignant neoplasm of breast: Secondary | ICD-10-CM | POA: Diagnosis not present

## 2023-09-13 ENCOUNTER — Other Ambulatory Visit (HOSPITAL_COMMUNITY): Payer: Self-pay

## 2023-09-14 ENCOUNTER — Other Ambulatory Visit: Payer: Self-pay

## 2023-10-15 ENCOUNTER — Other Ambulatory Visit (HOSPITAL_COMMUNITY): Payer: Self-pay

## 2023-10-15 DIAGNOSIS — E559 Vitamin D deficiency, unspecified: Secondary | ICD-10-CM | POA: Diagnosis not present

## 2023-10-15 DIAGNOSIS — Z1331 Encounter for screening for depression: Secondary | ICD-10-CM | POA: Diagnosis not present

## 2023-10-15 DIAGNOSIS — J439 Emphysema, unspecified: Secondary | ICD-10-CM | POA: Diagnosis not present

## 2023-10-15 DIAGNOSIS — Z0000 Encounter for general adult medical examination without abnormal findings: Secondary | ICD-10-CM | POA: Diagnosis not present

## 2023-10-15 DIAGNOSIS — F411 Generalized anxiety disorder: Secondary | ICD-10-CM | POA: Diagnosis not present

## 2023-10-15 DIAGNOSIS — D126 Benign neoplasm of colon, unspecified: Secondary | ICD-10-CM | POA: Diagnosis not present

## 2023-10-15 MED ORDER — PAROXETINE HCL 20 MG PO TABS
20.0000 mg | ORAL_TABLET | Freq: Every day | ORAL | 1 refills | Status: AC
  Filled 2023-10-15 – 2023-12-10 (×2): qty 90, 90d supply, fill #0

## 2023-11-19 ENCOUNTER — Emergency Department (HOSPITAL_COMMUNITY)

## 2023-11-19 ENCOUNTER — Other Ambulatory Visit: Payer: Self-pay

## 2023-11-19 ENCOUNTER — Observation Stay (HOSPITAL_COMMUNITY)
Admission: EM | Admit: 2023-11-19 | Discharge: 2023-11-20 | Disposition: A | Discharge status: Home / Self Care | Attending: Internal Medicine | Admitting: Internal Medicine

## 2023-11-19 ENCOUNTER — Encounter (HOSPITAL_COMMUNITY): Payer: Self-pay

## 2023-11-19 DIAGNOSIS — R9431 Abnormal electrocardiogram [ECG] [EKG]: Secondary | ICD-10-CM | POA: Diagnosis not present

## 2023-11-19 DIAGNOSIS — I672 Cerebral atherosclerosis: Secondary | ICD-10-CM | POA: Diagnosis not present

## 2023-11-19 DIAGNOSIS — Z79899 Other long term (current) drug therapy: Secondary | ICD-10-CM | POA: Insufficient documentation

## 2023-11-19 DIAGNOSIS — F419 Anxiety disorder, unspecified: Secondary | ICD-10-CM | POA: Diagnosis not present

## 2023-11-19 DIAGNOSIS — F17210 Nicotine dependence, cigarettes, uncomplicated: Secondary | ICD-10-CM | POA: Diagnosis not present

## 2023-11-19 DIAGNOSIS — F488 Other specified nonpsychotic mental disorders: Secondary | ICD-10-CM | POA: Insufficient documentation

## 2023-11-19 DIAGNOSIS — G459 Transient cerebral ischemic attack, unspecified: Principal | ICD-10-CM

## 2023-11-19 DIAGNOSIS — R29818 Other symptoms and signs involving the nervous system: Secondary | ICD-10-CM | POA: Diagnosis not present

## 2023-11-19 DIAGNOSIS — R29810 Facial weakness: Secondary | ICD-10-CM | POA: Diagnosis not present

## 2023-11-19 DIAGNOSIS — I639 Cerebral infarction, unspecified: Secondary | ICD-10-CM | POA: Insufficient documentation

## 2023-11-19 DIAGNOSIS — G43109 Migraine with aura, not intractable, without status migrainosus: Secondary | ICD-10-CM | POA: Diagnosis present

## 2023-11-19 DIAGNOSIS — G43909 Migraine, unspecified, not intractable, without status migrainosus: Principal | ICD-10-CM | POA: Insufficient documentation

## 2023-11-19 DIAGNOSIS — R200 Anesthesia of skin: Secondary | ICD-10-CM | POA: Diagnosis not present

## 2023-11-19 DIAGNOSIS — F444 Conversion disorder with motor symptom or deficit: Secondary | ICD-10-CM | POA: Diagnosis present

## 2023-11-19 LAB — URINALYSIS, ROUTINE W REFLEX MICROSCOPIC
Bilirubin Urine: NEGATIVE
Glucose, UA: NEGATIVE mg/dL
Ketones, ur: NEGATIVE mg/dL
Nitrite: NEGATIVE
Protein, ur: NEGATIVE mg/dL
Specific Gravity, Urine: 1.003 — ABNORMAL LOW (ref 1.005–1.030)
pH: 7 (ref 5.0–8.0)

## 2023-11-19 LAB — DIFFERENTIAL
Abs Immature Granulocytes: 0.01 10*3/uL (ref 0.00–0.07)
Basophils Absolute: 0 10*3/uL (ref 0.0–0.1)
Basophils Relative: 1 %
Eosinophils Absolute: 0.1 10*3/uL (ref 0.0–0.5)
Eosinophils Relative: 1 %
Immature Granulocytes: 0 %
Lymphocytes Relative: 39 %
Lymphs Abs: 1.8 10*3/uL (ref 0.7–4.0)
Monocytes Absolute: 0.4 10*3/uL (ref 0.1–1.0)
Monocytes Relative: 8 %
Neutro Abs: 2.4 10*3/uL (ref 1.7–7.7)
Neutrophils Relative %: 51 %

## 2023-11-19 LAB — I-STAT CHEM 8, ED
BUN: 9 mg/dL (ref 6–20)
Calcium, Ion: 1.2 mmol/L (ref 1.15–1.40)
Chloride: 105 mmol/L (ref 98–111)
Creatinine, Ser: 0.6 mg/dL (ref 0.44–1.00)
Glucose, Bld: 110 mg/dL — ABNORMAL HIGH (ref 70–99)
HCT: 43 % (ref 36.0–46.0)
Hemoglobin: 14.6 g/dL (ref 12.0–15.0)
Potassium: 3.9 mmol/L (ref 3.5–5.1)
Sodium: 140 mmol/L (ref 135–145)
TCO2: 22 mmol/L (ref 22–32)

## 2023-11-19 LAB — COMPREHENSIVE METABOLIC PANEL WITH GFR
ALT: 16 U/L (ref 0–44)
AST: 19 U/L (ref 15–41)
Albumin: 4.3 g/dL (ref 3.5–5.0)
Alkaline Phosphatase: 61 U/L (ref 38–126)
Anion gap: 10 (ref 5–15)
BUN: 10 mg/dL (ref 6–20)
CO2: 21 mmol/L — ABNORMAL LOW (ref 22–32)
Calcium: 9.7 mg/dL (ref 8.9–10.3)
Chloride: 107 mmol/L (ref 98–111)
Creatinine, Ser: 0.62 mg/dL (ref 0.44–1.00)
GFR, Estimated: >60 mL/min (ref >60)
Glucose, Bld: 113 mg/dL — ABNORMAL HIGH (ref 70–99)
Potassium: 3.9 mmol/L (ref 3.5–5.1)
Sodium: 138 mmol/L (ref 135–145)
Total Bilirubin: 0.4 mg/dL (ref 0.0–1.2)
Total Protein: 6.9 g/dL (ref 6.5–8.1)

## 2023-11-19 LAB — CBC
HCT: 41.9 % (ref 36.0–46.0)
Hemoglobin: 14.5 g/dL (ref 12.0–15.0)
MCH: 30.2 pg (ref 26.0–34.0)
MCHC: 34.6 g/dL (ref 30.0–36.0)
MCV: 87.3 fL (ref 80.0–100.0)
Platelets: 290 10*3/uL (ref 150–400)
RBC: 4.8 MIL/uL (ref 3.87–5.11)
RDW: 11.7 % (ref 11.5–15.5)
WBC: 4.7 10*3/uL (ref 4.0–10.5)
nRBC: 0 % (ref 0.0–0.2)

## 2023-11-19 LAB — HEMOGLOBIN A1C
Hgb A1c MFr Bld: 4.8 % (ref 4.8–5.6)
Mean Plasma Glucose: 91.06 mg/dL

## 2023-11-19 LAB — PROTIME-INR
INR: 1 (ref 0.8–1.2)
Prothrombin Time: 13.1 s (ref 11.4–15.2)

## 2023-11-19 LAB — RAPID URINE DRUG SCREEN, HOSP PERFORMED
Amphetamines: NOT DETECTED
Barbiturates: NOT DETECTED
Benzodiazepines: NOT DETECTED
Cocaine: NOT DETECTED
Opiates: NOT DETECTED
Tetrahydrocannabinol: NOT DETECTED

## 2023-11-19 LAB — FOLATE: Folate: 23 ng/mL (ref >5.9)

## 2023-11-19 LAB — APTT: aPTT: 27 s (ref 24–36)

## 2023-11-19 LAB — CBG MONITORING, ED: Glucose-Capillary: 108 mg/dL — ABNORMAL HIGH (ref 70–99)

## 2023-11-19 LAB — VITAMIN B12: Vitamin B-12: 376 pg/mL (ref 180–914)

## 2023-11-19 LAB — ETHANOL: Alcohol, Ethyl (B): <10 mg/dL (ref <10)

## 2023-11-19 LAB — HCG, SERUM, QUALITATIVE: Preg, Serum: NEGATIVE

## 2023-11-19 LAB — HIV ANTIBODY (ROUTINE TESTING W REFLEX): HIV Screen 4th Generation wRfx: NONREACTIVE

## 2023-11-19 MED ORDER — ASPIRIN 81 MG PO TBEC
81.0000 mg | DELAYED_RELEASE_TABLET | Freq: Every day | ORAL | Status: DC
  Administered 2023-11-20: 81 mg via ORAL
  Filled 2023-11-19: qty 1

## 2023-11-19 MED ORDER — SODIUM CHLORIDE 0.9% FLUSH: 3.0000 mL | Freq: Once | INTRAVENOUS | Status: DC

## 2023-11-19 MED ORDER — FAMOTIDINE 20 MG PO TABS: 20.0000 mg | ORAL_TABLET | Freq: Every day | ORAL | Status: DC | PRN

## 2023-11-19 MED ORDER — CLOPIDOGREL BISULFATE 75 MG PO TABS
75.0000 mg | ORAL_TABLET | Freq: Every day | ORAL | Status: DC
  Administered 2023-11-20: 75 mg via ORAL
  Filled 2023-11-19: qty 1

## 2023-11-19 MED ORDER — ENOXAPARIN SODIUM 40 MG/0.4ML IJ SOSY
40.0000 mg | PREFILLED_SYRINGE | INTRAMUSCULAR | Status: DC
  Administered 2023-11-19: 40 mg via SUBCUTANEOUS
  Filled 2023-11-19 (×2): qty 0.4

## 2023-11-19 MED ORDER — PAROXETINE HCL 20 MG PO TABS
20.0000 mg | ORAL_TABLET | Freq: Every day | ORAL | Status: DC
  Administered 2023-11-20: 20 mg via ORAL
  Filled 2023-11-19 (×3): qty 1

## 2023-11-19 MED ORDER — STROKE: EARLY STAGES OF RECOVERY BOOK
Freq: Once | Status: AC
  Filled 2023-11-19: qty 1

## 2023-11-19 MED ORDER — CLOPIDOGREL BISULFATE 300 MG PO TABS
300.0000 mg | ORAL_TABLET | Freq: Once | ORAL | Status: AC
  Administered 2023-11-19: 300 mg via ORAL
  Filled 2023-11-19: qty 1

## 2023-11-19 MED ORDER — ACETAMINOPHEN 650 MG RE SUPP: 650.0000 mg | Freq: Four times a day (QID) | RECTAL | Status: DC | PRN

## 2023-11-19 MED ORDER — IOHEXOL 350 MG/ML SOLN
75.0000 mL | Freq: Once | INTRAVENOUS | Status: AC | PRN
  Administered 2023-11-19: 75 mL via INTRAVENOUS

## 2023-11-19 MED ORDER — ASPIRIN 325 MG PO TABS
325.0000 mg | ORAL_TABLET | Freq: Every day | ORAL | Status: DC
Start: 2023-11-19 — End: 2023-11-20
  Filled 2023-11-19: qty 1

## 2023-11-19 MED ORDER — ACETAMINOPHEN 325 MG PO TABS: 650.0000 mg | ORAL_TABLET | Freq: Four times a day (QID) | ORAL | Status: DC | PRN

## 2023-11-19 NOTE — ED Notes (Signed)
 Pt to MRI via stretcher.

## 2023-11-19 NOTE — Evaluation (Signed)
 Occupational Therapy Evaluation Patient Details Name: Danielle Banks MRN: [id number] DOB: Jan 12, 1966 Today's Date: 11/19/2023   History of Present Illness   58 y.o. female adm 3/31 with a history of anemia and anxiety who presented with left-sided facial droop, numbness and weakness over left upper extremity left lower extremity.  Patient describes heavieness to L leg, but also numbness like symptoms to R leg.     Clinical Impressions Patient admitted for the diagnosis above.  PTA she lives at home with her spouse and child.  Patient is independent at home, works full time as a Hospital doctor for [employer].  Patient continues to express odd sensations to her bilateral lower extremities and L arm, but these deficits are not impacting her functionally.  She is up and walking the halls without assist, and needs no ADL support.  OT will continue to follow to ensure no changes as patient continues to be worked up.  No post acute Ot is anticipated.       If plan is discharge home, recommend the following:   Assist for transportation     Functional Status Assessment   Patient has not had a recent decline in their functional status     Equipment Recommendations   None recommended by OT     Recommendations for Other Services         Precautions/Restrictions         Mobility Bed Mobility Overal bed mobility: Modified Independent                  Transfers Overall transfer level: Modified independent Equipment used: None                      Balance Overall balance assessment: Mild deficits observed, not formally tested                                         ADL either performed or assessed with clinical judgement   ADL Overall ADL's : At baseline                                             Vision Patient Visual Report: No change from baseline       Perception Perception: Within Functional Limits       Praxis  Praxis: WFL       Pertinent Vitals/Pain Pain Assessment Pain Assessment: No/denies pain     Extremity/Trunk Assessment Upper Extremity Assessment Upper Extremity Assessment: Overall WFL for tasks assessed   Lower Extremity Assessment Lower Extremity Assessment: Defer to PT evaluation   Cervical / Trunk Assessment Cervical / Trunk Assessment: Normal   Communication Communication Communication: No apparent difficulties   Cognition Arousal: Alert Behavior During Therapy: WFL for tasks assessed/performed Cognition: No apparent impairments                                       Cueing  General Comments       VSS on RA   Exercises     Shoulder Instructions      Home Living Family/patient expects to be discharged to:: Private residence Living Arrangements: Spouse/significant other;Children Available Help at Discharge: Family;Available 24  hours/day Type of Home: House Home Access: Stairs to enter [employer] of Steps: 2 Entrance Stairs-Rails: None Home Layout: Two level;Bed/bath upstairs Alternate Level Stairs-Number of Steps: flight Alternate Level Stairs-Rails: Right Bathroom Shower/Tub: Walk-in shower;Tub/shower unit   Bathroom Toilet: Standard Bathroom Accessibility: Yes How Accessible: Accessible via walker Home Equipment: None          Prior Functioning/Environment Prior Level of Function : Independent/Modified Independent;Driving;Working/employed               ADLs Comments: Patient is a Chemical engineer with [employer]    OT Problem List: Impaired balance (sitting and/or standing)   OT Treatment/Interventions: Self-care/ADL training;Therapeutic activities;Balance training      OT Goals(Current goals can be found in the care plan section)   Acute Rehab OT Goals Patient Stated Goal: Return home OT Goal Formulation: With patient Time For Goal Achievement: 12/03/23 Potential to Achieve Goals: Good ADL Goals Pt Will  Perform Grooming: Independently;standing Pt Will Perform Lower Body Dressing: Independently;sit to/from stand Pt Will Transfer to Toilet: Independently;ambulating;regular height toilet   OT Frequency:  Min 2X/week    Co-evaluation              AM-PAC OT "6 Clicks" Daily Activity     Outcome Measure Help from another person eating meals?: None Help from another person taking care of personal grooming?: None Help from another person toileting, which includes using toliet, bedpan, or urinal?: None Help from another person bathing (including washing, rinsing, drying)?: None Help from another person to put on and taking off regular upper body clothing?: None Help from another person to put on and taking off regular lower body clothing?: None 6 Click Score: 24   End of Session Nurse Communication: Mobility status  Activity Tolerance: Patient tolerated treatment well Patient left: in bed;with call bell/phone within reach;with family/visitor present  OT Visit Diagnosis: Unsteadiness on feet (R26.81)                Time: 1610-9604 OT Time Calculation (min): 21 min Charges:  OT General Charges $OT Visit: 1 Visit OT Evaluation $OT Eval Moderate Complexity: 1 Mod  11/19/2023  RP, OTR/L  Acute Rehabilitation Services  Office:  [phone]   Suzanna Obey 11/19/2023, 1:45 PM

## 2023-11-19 NOTE — Code Documentation (Signed)
 Stroke Response Nurse Documentation Code Documentation  Danielle Banks is a 58 y.o. female arriving to [facility]  via [facility] on 3/31 with past medical hx of anxiety. On No antithrombotic. Code stroke was activated by ED.   Patient from home where she was LKW at 2000 last night and now complaining of numbness in LLE.   Stroke team at the bedside on patient arrival. Labs drawn and patient cleared for CT by Dr. Elayne Snare. Patient to CT with team. NIHSS 3, see documentation for details and code stroke times. Patient with left facial droop, left leg weakness, and left decreased sensation on exam. Facial droop is baseline for the patient. The following imaging was completed:  CT Head and CTA. Patient is not a candidate for IV Thrombolytic due to LKW 2000 last night. Patient is not a candidate for IR due to no LVO on CTA.   Care Plan: q2 NIHSS and vital signs.   Bedside handoff with ED RN Aggie Cosier.    Danielle Banks  Stroke Response RN

## 2023-11-19 NOTE — Plan of Care (Signed)

## 2023-11-19 NOTE — Hospital Course (Signed)
 58 yo F Anxiety  Stroke rule out -neuro following -RN here -LKW 0630am later revealed 2000 yesterday

## 2023-11-19 NOTE — H&P (Signed)
 History and Physical    Patient: Danielle Banks ZOX:[id number] DOB: 19-Sep-1965 DOA: 11/19/2023 DOS: the patient was seen and examined on 11/19/2023 PCP: Patient, No Pcp Per  Patient coming from: Home  Chief Complaint:  Chief Complaint  Patient presents with   Code Stroke   HPI: Danielle Banks is a 58 y.o. female with medical history significant of anxiety, chronic rhinitis presenting to the ED as a CODE stroke.  Patient reports that she began feeling weakness in her legs yesterday night, more so in her left leg.  She did not think much of it and went to sleep.  This morning around 6:30 AM, she began noticing worsening left leg weakness and numbness.  States that she feels like her left leg is carrying out 20 pound dumbbell.  Of note, patient does endorse chronic left facial droop.  She also reports a history of migraines, twice during her lifetime.  She does endorse a headache earlier today prior to when her symptoms worsened but this only lasted for a little while before resolving on its own.  Patient is a Engineer, civil (consulting) and given consolation of symptoms, she presented to the ED for further evaluation.  ED course: Vital signs stable.  CBC unremarkable.  CMP unremarkable.  Alcohol level normal.  CBG 108.  CT head negative.  Patient not given IV thrombolysis given outside of window.  CT angio head and neck without any emergent findings or flow reducing stenosis.  MRI brain normal.  Neurology consulted by ED provider.  [facility] hospitalist asked to evaluate patient for admission.  Review of Systems: As mentioned in the history of present illness. All other systems reviewed and are negative. Past Medical History:  Diagnosis Date   Anemia    Anxiety    Blood transfusion abn reaction or complication, no procedure mishap    Seasonal allergies    Urticaria    Past Surgical History:  Procedure Laterality Date   APPENDECTOMY     TUBAL LIGATION     TUMOR REMOVAL     necrotic tumor removal   Social  History:  reports that she has been smoking cigarettes. She has a 17.5 pack-year smoking history. She has never used smokeless tobacco. She reports that she does not drink alcohol and does not use drugs.  Allergies  Allergen Reactions   Kiwi Extract Other (See Comments)    Lip tingling and numbness    Family History  Problem Relation Age of Onset   Hyperlipidemia Mother    Osteoarthritis Mother    Hypothyroidism Mother    Heart failure Father    Hypertension Father    Heart disease Father    Asthma Father    Asthma Daughter    Hypertension Maternal Grandmother    Stroke Maternal Grandmother    Stroke Paternal Grandmother    Pulmonary embolism Paternal Grandmother    Early death Paternal Grandfather    Heart disease Paternal Grandfather    Stroke Maternal Grandfather    Allergic rhinitis Daughter    Asthma Sister    Angioedema Neg Hx     Prior to Admission medications   Medication Sig Start Date End Date Taking? Authorizing Provider  azelastine (ASTELIN) 0.1 % nasal spray 1-2 sprays per nostril 1-2 times daily as needed for runny nose 06/14/20   Bobbitt, Heywood Iles, MD  famotidine (PEPCID) 20 MG tablet Take one tablet twice daily as needed 06/14/20   Bobbitt, Heywood Iles, MD  fluticasone [facility]) 50 MCG/ACT nasal spray 1 spray  by Both Nostrils route daily.    [provider]  levocetirizine Elita Boone) 5 MG tablet Take one tablet daily as needed 06/14/20   Bobbitt, Heywood Iles, MD  PARoxetine (PAXIL) 20 MG tablet Take one daily for depression and anxiety 08/12/14   Peyton Najjar, MD  PARoxetine (PAXIL) 20 MG tablet Take 1 tablet (20 mg total) by mouth daily. 10/15/23       Physical Exam: Vitals:   11/19/23 0735 11/19/23 0800 11/19/23 0815 11/19/23 1320  BP: (!) 149/85 128/89 120/78   Pulse: 73 79 63   Resp: 14 12 13    Temp:    98.1 F (36.7 C)  TempSrc:    Axillary  SpO2: 100% 99% 98%    Physical Exam Constitutional:      Appearance: Normal appearance.  She is not ill-appearing.  HENT:     Head: Normocephalic and atraumatic.     Mouth/Throat:     Mouth: Mucous membranes are moist.     Pharynx: Oropharynx is clear. No oropharyngeal exudate.  Eyes:     General: No scleral icterus.    Extraocular Movements: Extraocular movements intact.     Conjunctiva/sclera: Conjunctivae normal.     Pupils: Pupils are equal, round, and reactive to light.  Cardiovascular:     Rate and Rhythm: Normal rate and regular rhythm.     Heart sounds: Normal heart sounds. No murmur heard.    No friction rub. No gallop.  Pulmonary:     Effort: Pulmonary effort is normal. No respiratory distress.     Breath sounds: Normal breath sounds. No wheezing, rhonchi or rales.  Abdominal:     General: Bowel sounds are normal. There is no distension.     Palpations: Abdomen is soft.     Tenderness: There is no abdominal tenderness. There is no guarding or rebound.  Musculoskeletal:        General: No swelling. Normal range of motion.     Cervical back: Normal range of motion.  Skin:    General: Skin is warm and dry.  Neurological:     Mental Status: She is alert.     Comments: AAOx4. Visual fields full. PERRL, EOMI, no nystagmus noted. Facial sensation symmetric. Left facial droop present (at baseline per patient). Hearing intact to voice. Symmetric uvula elevation. Symmetric shoulder shrug. Tongue protrusion midline. Strength 5/5 in bilateral upper extremities. Strength 4+/5 in RLE with no drift and 4-/5 in LLE with mild drift. Sensation diminished in left leg. FNF intact. Gait testing deferred.  Psychiatric:        Mood and Affect: Mood normal.        Behavior: Behavior normal.     Data Reviewed:  There are no new results to review at this time.    Latest Ref Rng & Units 11/19/2023    7:27 AM 11/19/2023    7:24 AM 06/14/2020   10:53 AM  CBC  WBC 4.0 - 10.5 K/uL  4.7  9.3   Hemoglobin 12.0 - 15.0 g/dL 16.1  09.6  04.5   Hematocrit 36.0 - 46.0 % 43.0  41.9  44.2    Platelets 150 - 400 K/uL  290  302       Latest Ref Rng & Units 11/19/2023    7:27 AM 11/19/2023    7:24 AM 06/14/2020   10:54 AM  CMP  Glucose 70 - 99 mg/dL 409  811  89   BUN 6 - 20 mg/dL 9  10  7  Creatinine 0.44 - 1.00 mg/dL 4.54  0.98  1.19   Sodium 135 - 145 mmol/L 140  138  141   Potassium 3.5 - 5.1 mmol/L 3.9  3.9  4.2   Chloride 98 - 111 mmol/L 105  107  105   CO2 22 - 32 mmol/L  21  23   Calcium 8.9 - 10.3 mg/dL  9.7  9.6   Total Protein 6.5 - 8.1 g/dL  6.9  6.8   Total Bilirubin 0.0 - 1.2 mg/dL  0.4  0.2   Alkaline Phos 38 - 126 U/L  61  86   AST 15 - 41 U/L  19  32   ALT 0 - 44 U/L  16  42    Alcohol Level    Component Value Date/Time   [facility] <10 11/19/2023 0724   MR BRAIN WO CONTRAST Result Date: 11/19/2023 CLINICAL DATA:  58 year old female code stroke presentation this morning. Left side deficits. EXAM: MRI HEAD WITHOUT CONTRAST TECHNIQUE: Multiplanar, multiecho pulse sequences of the brain and surrounding structures were obtained without intravenous contrast. COMPARISON:  CT head, CTA head and neck this morning. FINDINGS: Brain: No convincing restricted diffusion to suggest acute infarction. No midline shift, mass effect, evidence of mass lesion, ventriculomegaly, extra-axial collection or acute intracranial hemorrhage. Cervicomedullary junction and pituitary are within normal limits. Wallace Cullens and white matter signal is within normal limits for age throughout the brain. No cortical encephalomalacia. No chronic cerebral blood products on SWI. Deep gray nuclei, brainstem and cerebellum appear negative. Vascular: Major intracranial vascular flow voids are preserved. Skull and upper cervical spine: Negative. Normal bone marrow signal. Sinuses/Orbits: Negative. Other: Trace mastoid air cell fluid on the left, appears inconsequential. Negative visible nasopharynx. Negative visible scalp and face. IMPRESSION: Normal for age noncontrast MRI appearance of the Brain. Electronically  Signed   By: Odessa Fleming M.D.   On: 11/19/2023 10:20   CT ANGIO HEAD NECK W WO CM (CODE STROKE) Result Date: 11/19/2023 CLINICAL DATA:  Left facial droop and left arm numbness EXAM: CT ANGIOGRAPHY HEAD AND NECK WITH AND WITHOUT CONTRAST TECHNIQUE: Multidetector CT imaging of the head and neck was performed using the standard protocol during bolus administration of intravenous contrast. Multiplanar CT image reconstructions and MIPs were obtained to evaluate the vascular anatomy. Carotid stenosis measurements (when applicable) are obtained utilizing NASCET criteria, using the distal internal carotid diameter as the denominator. RADIATION DOSE REDUCTION: This exam was performed according to the departmental dose-optimization program which includes automated exposure control, adjustment of the mA and/or kV according to patient size and/or use of iterative reconstruction technique. CONTRAST:  75mL OMNIPAQUE IOHEXOL 350 MG/ML SOLN COMPARISON:  Head CT from earlier today FINDINGS: CTA NECK FINDINGS Aortic arch: Mild atheromatous plaque Right carotid system: Mild low-density plaque suggested at the bifurcation. No ulceration or stenosis. Left carotid system: Mild low-density plaque at the bifurcation. No ulceration or stenosis Vertebral arteries: No proximal subclavian stenosis. The vertebral arteries are smoothly contoured and widely patent. Skeleton: No acute or aggressive finding Other neck: No acute finding Upper chest: Clear apical lungs Review of the MIP images confirms the above findings CTA HEAD FINDINGS Anterior circulation: Major vessels are smoothly contoured and diffusely patent. No branch occlusion, beading, or aneurysm Posterior circulation: The vertebral and basilar arteries are smoothly contoured and diffusely patent. No branch occlusion, beading, or aneurysm Venous sinuses: Diffusely patent Anatomic variants: None significant Review of the MIP images confirms the above findings IMPRESSION: No emergent  finding or flow  reducing stenosis. Mild atherosclerosis. Electronically Signed   By: Tiburcio Pea M.D.   On: 11/19/2023 08:06   CT HEAD CODE STROKE WO CONTRAST Result Date: 11/19/2023 CLINICAL DATA:  Code stroke. Neuro deficit, acute, stroke suspected. Left-sided facial and arm numbness with left arm drift. EXAM: CT HEAD WITHOUT CONTRAST TECHNIQUE: Contiguous axial images were obtained from the base of the skull through the vertex without intravenous contrast. RADIATION DOSE REDUCTION: This exam was performed according to the departmental dose-optimization program which includes automated exposure control, adjustment of the mA and/or kV according to patient size and/or use of iterative reconstruction technique. COMPARISON:  Head CT 04/15/2013 FINDINGS: Brain: There is no evidence of an acute infarct, intracranial hemorrhage, mass, midline shift, or extra-axial fluid collection. Cerebral volume is normal. The ventricles are normal in size. Vascular: No hyperdense vessel or unexpected calcification. Skull: No fracture or suspicious lesion. Sinuses/Orbits: Visualized paranasal sinuses are clear. Trace left mastoid fluid. Unremarkable orbits. Other: None. ASPECTS (Alberta Stroke Program Early CT Score) - Ganglionic level infarction (caudate, lentiform nuclei, internal capsule, insula, M1-M3 cortex): 7 - Supraganglionic infarction (M4-M6 cortex): 3 Total score (0-10 with 10 being normal): 10 These results were communicated to Dr. Armanda Magic at 7:36 am on 11/19/2023 by text page via the [facility] messaging system. IMPRESSION: Negative head CT.  ASPECTS of 10. Electronically Signed   By: Sebastian Ache M.D.   On: 11/19/2023 07:38     Assessment and Plan: No notes have been filed under this hospital service. Service: Hospitalist  TIA versus complicated migraine Patient presenting with decreased sensation and weakness in her left leg with LKW around 2000 yesterday evening. Imaging negative for acute CVA. Neurology  following, suspect this is likely a TIA though complicated migraine unable to be ruled out. She has mild residual deficits in her left leg.  -Neurology consulted, appreciate assistance -f/u ECHO -f/u lipid panel, A1c, B12, folate, thiamine -DAPT x3 weeks, then aspirin monotherapy -lovenox for dvt ppx -permissive HTN with goal BP <220/110 for first 24 hours -telemetry -PT/OT/SLP eval -passed bedside swallow eval, started heart healthy diet -place in observation  Elevated BP -no prior diagnosis of HTN -BP 130s-140s/80s initially, has since improved to 120s/80s without any intervention -allowing for permissive HTN for first 24 hours with goal BP <220/110  Anxiety -Resume home paroxetine 20 mg daily   Advance Care Planning:   Code Status: Full Code   Consults: neurology  Family Communication: updated family at bedside  Severity of Illness: The appropriate patient status for this patient is OBSERVATION. Observation status is judged to be reasonable and necessary in order to provide the required intensity of service to ensure the patient's safety. The patient's presenting symptoms, physical exam findings, and initial radiographic and laboratory data in the context of their medical condition is felt to place them at decreased risk for further clinical deterioration. Furthermore, it is anticipated that the patient will be medically stable for discharge from the hospital within 2 midnights of admission.   Portions of this note were generated with Scientist, clinical (histocompatibility and immunogenetics). Dictation errors may occur despite best attempts at proofreading.  Author: Briscoe Burns, MD 11/19/2023 1:21 PM  For on call review [email or web address].

## 2023-11-19 NOTE — Consult Note (Signed)
 NEUROLOGY CONSULT NOTE   Date of service: November 19, 2023 Patient Name: Danielle Banks MRN:  [id number] DOB:  Sep 22, 1965 Chief Complaint: "left facial droop, left side numbness" Requesting Provider: Royanne Foots, DO  History of Present Illness  Danielle Banks is a 58 y.o. female with hx of anxiety who presented to triage from coming into work this morning, called CODE STROKE due to left facial droop, left-sided numbness. Patient originally stated that the "off feeling" began this morning, but then stated that her legs were numb and weak last night, more so in her left leg. Patient states left facial droop is chronic. She has had migraines twice in her life, denies taking any medications for headaches/migraines regularly. She endorses a headache this morning when her symptoms seemed to worsen that lasted for "just a little bit". Patient also endorses vaping, educated on cessation.  Her SBP was in the 160s, which she states is very high for her, she does not take any antihypertensives. CBG WNL.  CTH/CTA negative.  LKW: originally thought to be 0630 when code stroke was called, later revealed to be around 2000 last night.  Modified rankin score: 0-Completely asymptomatic and back to baseline post- stroke IV Thrombolysis: No, outside of window EVT: No, No LVO  NIHSS components Score: Comment  1a Level of Conscious 0[]  1[]  2[]  3[]      1b LOC Questions 0[]  1[]  2[]       1c LOC Commands 0[]  1[]  2[]       2 Best Gaze 0[]  1[]  2[]       3 Visual 0[]  1[]  2[]  3[]      4 Facial Palsy 0[]  1[x]  2[]  3[]     Baseline per patient  5a Motor Arm - left 0[]  1[]  2[]  3[]  4[]  UN[]    5b Motor Arm - Right 0[]  1[]  2[]  3[]  4[]  UN[]    6a Motor Leg - Left 0[]  1[x]  2[]  3[]  4[]  UN[]    6b Motor Leg - Right 0[]  1[]  2[]  3[]  4[]  UN[]    7 Limb Ataxia 0[]  1[]  2[]  3[]  UN[]     8 Sensory 0[]  1[x]  2[]  UN[]      9 Best Language 0[]  1[]  2[]  3[]      10 Dysarthria 0[]  1[]  2[]  UN[]      11 Extinct. and Inattention 0[]  1[]  2[]        TOTAL:   3      ROS  Comprehensive ROS performed and pertinent positives documented in HPI   Past History   Past Medical History:  Diagnosis Date   Anemia    Anxiety    Blood transfusion abn reaction or complication, no procedure mishap    Seasonal allergies    Urticaria     Past Surgical History:  Procedure Laterality Date   APPENDECTOMY     TUBAL LIGATION     TUMOR REMOVAL     necrotic tumor removal    Family History: Family History  Problem Relation Age of Onset   Hyperlipidemia Mother    Osteoarthritis Mother    Hypothyroidism Mother    Heart failure Father    Hypertension Father    Heart disease Father    Asthma Father    Asthma Daughter    Hypertension Maternal Grandmother    Stroke Maternal Grandmother    Stroke Paternal Grandmother    Pulmonary embolism Paternal Grandmother    Early death Paternal Grandfather    Heart disease Paternal Grandfather    Stroke Maternal Grandfather    Allergic rhinitis Daughter  Asthma Sister    Angioedema Neg Hx     Social History  reports that she has been smoking cigarettes. She has a 17.5 pack-year smoking history. She has never used smokeless tobacco. She reports that she does not drink alcohol and does not use drugs.  Allergies  Allergen Reactions   Kiwi Extract Other (See Comments)    Lip tingling and numbness    Medications   Current Facility-Administered Medications:    sodium chloride flush (NS) 0.9 % injection 3 mL, 3 mL, Intravenous, Once, Royanne Foots, DO  Current Outpatient Medications:    amoxicillin-clavulanate (AUGMENTIN) 875-125 MG tablet, Take 1 tablet by mouth 2 (two) times daily., Disp: 20 tablet, Rfl: 0   azelastine (ASTELIN) 0.1 % nasal spray, 1-2 sprays per nostril 1-2 times daily as needed for runny nose, Disp: 30 mL, Rfl: 5   doxycycline (VIBRAMYCIN) 100 MG capsule, Take 1 capsule (100 mg total) by mouth 2 (two) times daily for 7 days, Disp: 14 capsule, Rfl: 0   famotidine (PEPCID)  20 MG tablet, Take one tablet twice daily as needed, Disp: 60 tablet, Rfl: 5   fluticasone (FLONASE) 50 MCG/ACT nasal spray, 1 spray by Both Nostrils route daily., Disp: , Rfl:    hydrOXYzine (ATARAX/VISTARIL) 25 MG tablet, Take 25 mg by mouth 3 (three) times daily. (Patient not taking: Reported on 06/14/2020), Disp: , Rfl:    levocetirizine (XYZAL) 5 MG tablet, Take one tablet daily as needed, Disp: 30 tablet, Rfl: 5   PARoxetine (PAXIL) 10 MG tablet, Take 1 tablet (10 mg total) by mouth daily., Disp: 90 tablet, Rfl: 0   PARoxetine (PAXIL) 20 MG tablet, Take one daily for depression and anxiety, Disp: 30 tablet, Rfl: 5   PARoxetine (PAXIL) 20 MG tablet, Take 1 tablet (20 mg total) by mouth daily., Disp: 90 tablet, Rfl: 1   PARoxetine (PAXIL) 20 MG tablet, Take 1 tablet (20 mg total) by mouth daily., Disp: 90 tablet, Rfl: 1  Vitals   Vitals:   11/19/23 0718  BP: (!) 130/93  Pulse: 80  Resp: 15  Temp: 98 F (36.7 C)  SpO2: 98%    There is no height or weight on file to calculate BMI.  Physical Exam   Constitutional: Appears well-developed and well-nourished.  Psych: Affect appropriate to situation.  Eyes: No scleral injection.  HENT: No OP obstruction.  Head: Normocephalic.  Cardiovascular: Normal rate and regular rhythm.  Respiratory: Effort normal, non-labored breathing.  GI: Soft.  No distension. There is no tenderness.  Skin: WDI.   Neurologic Examination   Neuro: Mental Status: Patient is awake, alert, oriented to person, place, month, year, and situation. Patient is able to give a clear and coherent history. No signs of dysarthria, aphasia or neglect  Cranial Nerves: II: Visual Fields are full. Pupils are equal, round, and reactive to light.   III,IV, VI: EOMI without ptosis or diploplia.  V: Facial sensation is symmetric to light touch VII: Left facial droop (baseline per patient) VIII: hearing is intact to voice X: Uvula elevates symmetrically XI: Shoulder  shrug is symmetric. XII: tongue is midline   Motor: Tone is normal. Bulk is normal.  RLE: 4+/5 with no drift LLE: 4-/5 with mild drift. This did improve throughout exam.   Sensory: Sensation is reduced in left leg.  Deep Tendon Reflexes: 2+ and symmetric in the biceps and patellae.  Cerebellar: FNF and HKS are intact bilaterally No overt ataxia noted.   Labs/Imaging/Neurodiagnostic studies   CBC:  Recent Labs  Lab 11/19/23 0724 11/19/23 0727  WBC 4.7  --   NEUTROABS 2.4  --   HGB 14.5 14.6  HCT 41.9 43.0  MCV 87.3  --   PLT 290  --    Basic Metabolic Panel:  Lab Results  Component Value Date   NA 140 11/19/2023   K 3.9 11/19/2023   CO2 23 06/14/2020   GLUCOSE 110 (H) 11/19/2023   BUN 9 11/19/2023   CREATININE 0.60 11/19/2023   CALCIUM 9.6 06/14/2020   GFRNONAA 112 06/14/2020   GFRAA 130 06/14/2020   Lipid Panel: No results found for: "LDLCALC" HgbA1c:  Lab Results  Component Value Date   HGBA1C 5.2 12/07/2012   Urine Drug Screen: No results found for: "LABOPIA", "COCAINSCRNUR", "LABBENZ", "AMPHETMU", "THCU", "LABBARB"  Alcohol Level No results found for: "ETH" INR No results found for: "INR" APTT No results found for: "APTT" AED levels: No results found for: "PHENYTOIN", "ZONISAMIDE", "LAMOTRIGINE", "LEVETIRACETA"  CT Head without contrast(Personally reviewed): Negative. ASPECTS 10.   CT angio Head and Neck with contrast(Personally reviewed): No LVO or other emergent finding.   MRI Brain(Personally reviewed): PENDING   ASSESSMENT   58 y.o. female with hx of anxiety who presented to triage, called CODE STROKE due to left facial droop, left-sided numbness. Patient originally stated that the "off feeling" began this morning, but then stated that her legs were numb and weak last night, more so in her left leg.  NIH: 3 on exam. Symptoms of decreased sensation and left leg weakness seem ed to improved throughout examination. CTH/CTA negative.  She has  had migraines twice in her life, denies taking any medications for headaches/migraines regularly. She endorses a headache this morning when her symptoms seemed to worsen that lasted for "just a little bit".   Her SBP was in the 160s, which she states is very high for her, she does not take any antihypertensives. CBG WNL.   Impression: likely functional neurological disorder given fluctuating exam vs less likely Stroke bus still needs to be ruled out with MRI vs Complicated Migraine (transient headache but currently not in pain) vs Hypertensive Emergency (SBP in 170s initially).    RECOMMENDATIONS   - UPDATE: negative MRI, patient will not need to complete further stroke workup. Not a TIA since patient still had fluctuating symptoms when MRI was done. No indication for Aspirin and Plavix in the absence of stroke. Discontinue.  - Recommend BP management and outpatient follow up for migraines and functional neurological disorder   - DVT ppx - lovenox - Smoking/Vaping cessation - will counsel patient - Swallow screen - will be performed prior to PO intake - B12/Folate/Thiamine labs - UA/UDS - Please contact Neurology for any other questions.   ______________________________________________________________________   Pt seen by Neuro NP/APP and MD. Note/plan edited by MD.    Lynnae January, DNP, AGACNP-BC [facility] Please use AMION for contact information & EPIC for messaging.   I have evaluated patient, reviewed images and edited this note.  Anibal Henderson, MD [facility]

## 2023-11-19 NOTE — ED Triage Notes (Signed)
 Pt came in from work reporting on the way to work this morning both of her legs felt like they began to tingle, feeling like "ants crawling in my body." Does have Lt sided facial droop at baseline she noticed had worsened. LKN 0630, A/Ox4, during triage does have decreased sensation to Lt side of face & arm with Lt arm drift during assessment.

## 2023-11-19 NOTE — ED Provider Notes (Signed)
 [place] EMERGENCY DEPARTMENT AT [facility] Provider Note   CSN: [id number] Arrival date & time: 11/19/23  4098  An emergency department physician performed an initial assessment on this suspected stroke patient at 0725.  History  Chief Complaint  Patient presents with   Code Stroke    ETOLA MULL is a 58 y.o. female.  With a history of anemia and anxiety who presents to the ED given concern for potential stroke.  Last known well time 0630.  Patient reports left-sided facial droop and numbness and weakness over left upper extremity left lower extremity.  She had attempted to start her workday today but was unable to due to the symptoms.  Patient does also report a headache this morning.  She denies changes in vision falls trauma and recent injury.  No anticoagulation.  HPI     Home Medications Prior to Admission medications   Medication Sig Start Date End Date Taking? Authorizing Provider  azelastine (ASTELIN) 0.1 % nasal spray 1-2 sprays per nostril 1-2 times daily as needed for runny nose 06/14/20   Bobbitt, Heywood Iles, MD  famotidine (PEPCID) 20 MG tablet Take one tablet twice daily as needed 06/14/20   Bobbitt, Heywood Iles, MD  fluticasone [facility]) 50 MCG/ACT nasal spray 1 spray by Both Nostrils route daily.    [provider]  levocetirizine Elita Boone) 5 MG tablet Take one tablet daily as needed 06/14/20   Bobbitt, Heywood Iles, MD  PARoxetine (PAXIL) 20 MG tablet Take one daily for depression and anxiety 08/12/14   Peyton Najjar, MD  PARoxetine (PAXIL) 20 MG tablet Take 1 tablet (20 mg total) by mouth daily. 10/15/23         Allergies    Kiwi extract    Review of Systems   Review of Systems  Physical Exam Updated Vital Signs BP 120/78   Pulse 63   Temp 98 F (36.7 C)   Resp 13   SpO2 98%  Physical Exam Vitals and nursing note reviewed.  HENT:     Head: Normocephalic and atraumatic.  Eyes:     Pupils: Pupils are equal, round, and  reactive to light.  Cardiovascular:     Rate and Rhythm: Normal rate and regular rhythm.  Pulmonary:     Effort: Pulmonary effort is normal.     Breath sounds: Normal breath sounds.  Abdominal:     Palpations: Abdomen is soft.     Tenderness: There is no abdominal tenderness.  Skin:    General: Skin is warm and dry.  Neurological:     Mental Status: She is alert.     Motor: Weakness present.     Comments: Mild left facial palsy Diminished strength in left upper extremity Decreased sensation to light touch in left upper extremity left lower extremity  Psychiatric:        Mood and Affect: Mood normal.     ED Results / Procedures / Treatments   Labs (all labs ordered are listed, but only abnormal results are displayed) Labs Reviewed  COMPREHENSIVE METABOLIC PANEL WITH GFR - Abnormal; Notable for the following components:      Result Value   CO2 21 (*)    Glucose, Bld 113 (*)    All other components within normal limits  I-STAT CHEM 8, ED - Abnormal; Notable for the following components:   Glucose, Bld 110 (*)    All other components within normal limits  CBG MONITORING, ED - Abnormal; Notable for the following  components:   Glucose-Capillary 108 (*)    All other components within normal limits  PROTIME-INR  APTT  CBC  DIFFERENTIAL  ETHANOL  HCG, SERUM, QUALITATIVE  HEMOGLOBIN A1C  RAPID URINE DRUG SCREEN, HOSP PERFORMED  URINALYSIS, ROUTINE W REFLEX MICROSCOPIC  VITAMIN B12  FOLATE  VITAMIN B1  HIV ANTIBODY (ROUTINE TESTING W REFLEX)    EKG None  Radiology CT ANGIO HEAD NECK W WO CM (CODE STROKE) Result Date: 11/19/2023 CLINICAL DATA:  Left facial droop and left arm numbness EXAM: CT ANGIOGRAPHY HEAD AND NECK WITH AND WITHOUT CONTRAST TECHNIQUE: Multidetector CT imaging of the head and neck was performed using the standard protocol during bolus administration of intravenous contrast. Multiplanar CT image reconstructions and MIPs were obtained to evaluate the  vascular anatomy. Carotid stenosis measurements (when applicable) are obtained utilizing NASCET criteria, using the distal internal carotid diameter as the denominator. RADIATION DOSE REDUCTION: This exam was performed according to the departmental dose-optimization program which includes automated exposure control, adjustment of the mA and/or kV according to patient size and/or use of iterative reconstruction technique. CONTRAST:  75mL OMNIPAQUE IOHEXOL 350 MG/ML SOLN COMPARISON:  Head CT from earlier today FINDINGS: CTA NECK FINDINGS Aortic arch: Mild atheromatous plaque Right carotid system: Mild low-density plaque suggested at the bifurcation. No ulceration or stenosis. Left carotid system: Mild low-density plaque at the bifurcation. No ulceration or stenosis Vertebral arteries: No proximal subclavian stenosis. The vertebral arteries are smoothly contoured and widely patent. Skeleton: No acute or aggressive finding Other neck: No acute finding Upper chest: Clear apical lungs Review of the MIP images confirms the above findings CTA HEAD FINDINGS Anterior circulation: Major vessels are smoothly contoured and diffusely patent. No branch occlusion, beading, or aneurysm Posterior circulation: The vertebral and basilar arteries are smoothly contoured and diffusely patent. No branch occlusion, beading, or aneurysm Venous sinuses: Diffusely patent Anatomic variants: None significant Review of the MIP images confirms the above findings IMPRESSION: No emergent finding or flow reducing stenosis. Mild atherosclerosis. Electronically Signed   By: Tiburcio Pea M.D.   On: 11/19/2023 08:06   CT HEAD CODE STROKE WO CONTRAST Result Date: 11/19/2023 CLINICAL DATA:  Code stroke. Neuro deficit, acute, stroke suspected. Left-sided facial and arm numbness with left arm drift. EXAM: CT HEAD WITHOUT CONTRAST TECHNIQUE: Contiguous axial images were obtained from the base of the skull through the vertex without intravenous contrast.  RADIATION DOSE REDUCTION: This exam was performed according to the departmental dose-optimization program which includes automated exposure control, adjustment of the mA and/or kV according to patient size and/or use of iterative reconstruction technique. COMPARISON:  Head CT 04/15/2013 FINDINGS: Brain: There is no evidence of an acute infarct, intracranial hemorrhage, mass, midline shift, or extra-axial fluid collection. Cerebral volume is normal. The ventricles are normal in size. Vascular: No hyperdense vessel or unexpected calcification. Skull: No fracture or suspicious lesion. Sinuses/Orbits: Visualized paranasal sinuses are clear. Trace left mastoid fluid. Unremarkable orbits. Other: None. ASPECTS ([place] Stroke Program Early CT Score) - Ganglionic level infarction (caudate, lentiform nuclei, internal capsule, insula, M1-M3 cortex): 7 - Supraganglionic infarction (M4-M6 cortex): 3 Total score (0-10 with 10 being normal): 10 These results were communicated to Dr. Armanda Magic at 7:36 am on 11/19/2023 by text page via the [facility] messaging system. IMPRESSION: Negative head CT.  ASPECTS of 10. Electronically Signed   By: Sebastian Ache M.D.   On: 11/19/2023 07:38    Procedures Procedures    Medications Ordered in ED Medications  sodium chloride flush (NS)  0.9 % injection 3 mL (has no administration in time range)  aspirin tablet 325 mg (has no administration in time range)  clopidogrel (PLAVIX) tablet 300 mg (has no administration in time range)   stroke: early stages of recovery book (has no administration in time range)  aspirin EC tablet 81 mg (has no administration in time range)  clopidogrel (PLAVIX) tablet 75 mg (has no administration in time range)  enoxaparin (LOVENOX) injection 40 mg (has no administration in time range)  acetaminophen (TYLENOL) tablet 650 mg (has no administration in time range)    Or  acetaminophen (TYLENOL) suppository 650 mg (has no administration in time range)  PARoxetine  (PAXIL) tablet 20 mg (has no administration in time range)  famotidine (PEPCID) tablet 20 mg (has no administration in time range)  iohexol (OMNIPAQUE) 350 MG/ML injection 75 mL (75 mLs Intravenous Contrast Given 11/19/23 0747)    ED Course/ Medical Decision Making/ A&P Clinical Course as of 11/19/23 1014  Mon Nov 19, 2023  0740 No evidence of acute intracranial hemorrhage on CT head.  Patient assessed with neurology team Dr.Dessie.  Her left lower extremity weakness seems to be improving.  After discussion of the risks and benefits of TNK for potential ischemic stroke, patient wishes to hold off on TNK at this time in consideration of her improving strength.  We will not obtain CT a of the head and neck and plan for MRI and admission for further CVA/TIA workup [MP]  0825 No evidence of LVO on CTA head and neck.  Plan for load with aspirin and Plavix and admission to medicine for further monitoring and evaluation [MP]    Clinical Course User Index [MP] Royanne Foots, DO                                 Medical Decision Making 58 year old female with history as above presenting given concern for acute stroke.  Last known well time of 0630 based upon her history.  Deficits of left facial droop left upper extremity left lower extremity weakness and diminished in station left upper left lower extremity.  Code stroke was activated and she was examined along with the neuro stroke team.  Stroke scale score of 3 based on left facial palsy, sensory deficit and left upper extremity motor deficit.  CT head showed no acute bleed.  Will obtain CT a head and neck and stroke workup including MRI.  Admission likely.  Amount and/or Complexity of Data Reviewed Labs: ordered. Radiology: ordered.  Risk Decision regarding hospitalization.           Final Clinical Impression(s) / ED Diagnoses Final diagnoses:  TIA (transient ischemic attack)    Rx / DC Orders ED Discharge Orders     None          Royanne Foots, DO 11/19/23 1014

## 2023-11-20 ENCOUNTER — Observation Stay (HOSPITAL_BASED_OUTPATIENT_CLINIC_OR_DEPARTMENT_OTHER)

## 2023-11-20 DIAGNOSIS — G43109 Migraine with aura, not intractable, without status migrainosus: Secondary | ICD-10-CM

## 2023-11-20 DIAGNOSIS — F444 Conversion disorder with motor symptom or deficit: Secondary | ICD-10-CM | POA: Diagnosis present

## 2023-11-20 DIAGNOSIS — G459 Transient cerebral ischemic attack, unspecified: Secondary | ICD-10-CM | POA: Diagnosis not present

## 2023-11-20 DIAGNOSIS — F419 Anxiety disorder, unspecified: Secondary | ICD-10-CM

## 2023-11-20 LAB — BASIC METABOLIC PANEL WITH GFR
Anion gap: 11 (ref 5–15)
BUN: 14 mg/dL (ref 6–20)
CO2: 23 mmol/L (ref 22–32)
Calcium: 9.3 mg/dL (ref 8.9–10.3)
Chloride: 106 mmol/L (ref 98–111)
Creatinine, Ser: 0.63 mg/dL (ref 0.44–1.00)
GFR, Estimated: >60 mL/min (ref >60)
Glucose, Bld: 92 mg/dL (ref 70–99)
Potassium: 3.8 mmol/L (ref 3.5–5.1)
Sodium: 140 mmol/L (ref 135–145)

## 2023-11-20 LAB — ECHOCARDIOGRAM COMPLETE
AR max vel: 2.61 cm2
AV Area VTI: 2.51 cm2
AV Area mean vel: 2.41 cm2
AV Mean grad: 4 mmHg
AV Peak grad: 7.4 mmHg
Ao pk vel: 1.36 m/s
Area-P 1/2: 2.55 cm2
Calc EF: 62.4 %
MV VTI: 2.7 cm2
S' Lateral: 3 cm
Single Plane A2C EF: 61.8 %
Single Plane A4C EF: 63.2 %

## 2023-11-20 LAB — LIPID PANEL
Cholesterol: 161 mg/dL (ref 0–200)
HDL: 47 mg/dL (ref >40)
LDL Cholesterol: 92 mg/dL (ref 0–99)
Total CHOL/HDL Ratio: 3.4 ratio
Triglycerides: 108 mg/dL (ref <150)
VLDL: 22 mg/dL (ref 0–40)

## 2023-11-20 LAB — CBC
HCT: 40.6 % (ref 36.0–46.0)
Hemoglobin: 14.1 g/dL (ref 12.0–15.0)
MCH: 30.5 pg (ref 26.0–34.0)
MCHC: 34.7 g/dL (ref 30.0–36.0)
MCV: 87.7 fL (ref 80.0–100.0)
Platelets: 269 10*3/uL (ref 150–400)
RBC: 4.63 MIL/uL (ref 3.87–5.11)
RDW: 11.9 % (ref 11.5–15.5)
WBC: 5 10*3/uL (ref 4.0–10.5)
nRBC: 0 % (ref 0.0–0.2)

## 2023-11-20 MED ORDER — ACETAMINOPHEN 325 MG PO TABS: 650.0000 mg | ORAL_TABLET | Freq: Four times a day (QID) | ORAL | Status: AC | PRN

## 2023-11-20 MED ORDER — NICOTINE 14 MG/24HR TD PT24
14.0000 mg | MEDICATED_PATCH | Freq: Every day | TRANSDERMAL | Status: DC
  Filled 2023-11-20: qty 1

## 2023-11-20 NOTE — TOC Transition Note (Signed)
 Transition of Care [facility]) - Discharge Note   Patient Details  Name: Danielle Banks MRN: [id number] Date of Birth: [birth year]  [facility] CM/SW Contact:  Kermit Balo, RN Phone Number: 11/20/2023, 1:55 PM   Clinical Narrative:     Pt is discharging home with self care. No follow up per therapies. PCP: Allayne Gitelman. Pt has transportation home.  Final next level of care: Home/Self Care Barriers to Discharge: No Barriers Identified   Patient Goals and CMS Choice            Discharge Placement                       Discharge Plan and Services Additional resources added to the After Visit Summary for                                       Social Drivers of Health (SDOH) Interventions SDOH Screenings   Food Insecurity: No Food Insecurity (11/19/2023)  Housing: Low Risk  (11/19/2023)  Transportation Needs: No Transportation Needs (11/19/2023)  Utilities: Not At Risk (11/19/2023)  Financial Resource Strain: Low Risk  (10/12/2023)   Received from [facility]  Physical Activity: Unknown (10/12/2023)   Received from [facility]  Social Connections: Socially Integrated (10/12/2023)   Received from [facility]  Stress: No Stress Concern Present (10/12/2023)   Received from [facility]  Tobacco Use: High Risk (11/19/2023)     Readmission Risk Interventions     No data to display

## 2023-11-20 NOTE — Discharge Summary (Signed)
 Physician Discharge Summary  Danielle Banks ZOX:[id number] DOB: [birth year] DOA: 11/19/2023  PCP: Patient, No Pcp Per  Admit date: 11/19/2023 Discharge date: 11/20/2023  Time spent: 55 minutes  Recommendations for Outpatient Follow-up:  Follow-up with PCP in 2 weeks.  On follow-up patient will need a basic metabolic profile done to follow-up on electrolytes and renal function. Follow-up with [facility] in 4 weeks for follow-up on probable complicated migraine versus functional neurological symptom.   Discharge Diagnoses:  Principal Problem:   Complicated migraine Active Problems:   Functional neurological symptom disorder with weakness or paralysis   Anxiety   Discharge Condition: Stable and improved.  Diet recommendation: Heart healthy  There were no vitals filed for this visit.  History of present illness:  HPI per Dr. Newton Pigg Danielle Banks is a 58 y.o. female with medical history significant of anxiety, chronic rhinitis presenting to the ED as a CODE stroke.   Patient reports that she began feeling weakness in her legs yesterday night, more so in her left leg.  She did not think much of it and went to sleep.  This morning around 6:30 AM, she began noticing worsening left leg weakness and numbness.  States that she feels like her left leg is carrying out 20 pound dumbbell.  Of note, patient does endorse chronic left facial droop.  She also reports a history of migraines, twice during her lifetime.  She does endorse a headache earlier today prior to when her symptoms worsened but this only lasted for a little while before resolving on its own.  Patient is a Engineer, civil (consulting) and given consolation of symptoms, she presented to the ED for further evaluation.   ED course: Vital signs stable.  CBC unremarkable.  CMP unremarkable.  Alcohol level normal.  CBG 108.  CT head negative.  Patient not given IV thrombolysis given outside of window.  CT angio head and neck without any  emergent findings or flow reducing stenosis.  MRI brain normal.  Neurology consulted by ED provider.  [facility] hospitalist asked to evaluate patient for admission.  Hospital Course:  #1 complicated migraine versus functional neurological symptom disorder/TIA ruled out -Patient presented with decreased sensation and weakness in the left lower extremity with concerns for possible TIA. -CT head done negative for acute CVA. -CT angiogram head and neck done with no LVO.  Mild atherosclerosis. -MRI brain done negative for any acute abnormalities. -2D echo with EF of 60 to 65%,NWMA, no atrial level shunt detected by color-flow Doppler. -Fasting lipid panel with a total cholesterol 161, LDL of 92, triglycerides of 108.  Folate level noted at 23, vitamin B12 at 376. -Hemoglobin A1c noted at 4.8. -Initially placed on DAPT with aspirin and Plavix pending stroke workup. -Patient seen in consultation by neurology who felt patient did not have a TIA since patient had fluctuating symptoms when MRI was done and recommended no indication for aspirin and Plavix in the absence of stroke. -Aspirin and Plavix subsequently discontinued. -He was felt patient's presentation was likely secondary to a complicated migraine versus functional neurological symptom. -Patient improved clinically and was back to baseline by day of discharge. -Patient was discharged in stable and improved condition with outpatient follow-up with neurology.  2.  Elevated blood pressure -Patient with no prior diagnosis of hypertension. -BP improved and elevated blood pressure had resolved by day of discharge.  3.  Anxiety -Patient maintained on home regimen paroxetine 20 mg daily.  Procedures: CT head 11/19/2023 CT angiogram head and  neck 11/19/2023 2D echo 11/20/2023 MRI brain 11/19/2023  Consultations: Neurology: Dr. Armanda Magic 11/19/2023  Discharge Exam: Vitals:   11/20/23 0747 11/20/23 1209  BP: 123/87 111/69  Pulse: 76 73  Resp: 16 16   Temp: 98.7 F (37.1 C) 98.6 F (37 C)  SpO2: 97% 98%    General: NAD Cardiovascular: RRR no murmurs rubs or gallops.  No JVD.  No lower extremity edema. Respiratory: CTAB.  No wheezes, no crackles, no rhonchi.  Fair air movement.  Speaking in full sentences.  Discharge Instructions   Discharge Instructions     Ambulatory referral to Neurology   Complete by: As directed    An appointment is requested in approximately: 4 weeks   Diet - low sodium heart healthy   Complete by: As directed    Increase activity slowly   Complete by: As directed       Allergies as of 11/20/2023       Reactions   Kiwi Extract Other (See Comments)   Lip tingling and numbness        Medication List     TAKE these medications    acetaminophen 325 MG tablet Commonly known as: TYLENOL Take 2 tablets (650 mg total) by mouth every 6 (six) hours as needed for mild pain (pain score 1-3) (or Fever >/= 101).   EYE DROPS OP Apply 1 drop to eye as needed (dry eye, allergies). OTC   PARoxetine 20 MG tablet Commonly known as: PAXIL Take 1 tablet (20 mg total) by mouth daily. What changed: how much to take       Allergies  Allergen Reactions   Kiwi Extract Other (See Comments)    Lip tingling and numbness    Follow-up Information     PCP. Schedule an appointment as soon as possible for a visit in 2 week(s).          [facility]. Schedule an appointment as soon as possible for a visit in 4 week(s).   Specialty: Neurology Contact information: [address] [place] [address] [phone]                 The results of significant diagnostics from this hospitalization (including imaging, microbiology, ancillary and laboratory) are listed below for reference.    Significant Diagnostic Studies: ECHOCARDIOGRAM COMPLETE Result Date: 11/20/2023    ECHOCARDIOGRAM REPORT   Patient Name:   Danielle Banks Date of Exam:  11/20/2023 Medical Rec #:  [id number]       Height:       62.0 in Accession #:    [id number]      Weight:       145.4 lb Date of Birth:  [birth year]       BSA:          1.669 m Patient Age:    57 years        BP:           123/71 mmHg Patient Gender: F               HR:           73 bpm. Exam Location:  Inpatient Procedure: 2D Echo, Cardiac Doppler and Color Doppler (Both Spectral and Color            Flow Doppler were utilized during procedure). Indications:    TIA  History:        Patient has no prior history of Echocardiogram examinations.  Sonographer:    Amy Chionchio Referring Phys: Lynnae January IMPRESSIONS  1. Left ventricular ejection fraction, by estimation, is 60 to 65%. The left ventricle has normal function. The left ventricle has no regional wall motion abnormalities. Left ventricular diastolic parameters were normal.  2. Right ventricular systolic function is normal. The right ventricular size is normal.  3. The mitral valve is normal in structure. No evidence of mitral valve regurgitation. No evidence of mitral stenosis.  4. The aortic valve is normal in structure. Aortic valve regurgitation is not visualized. No aortic stenosis is present.  5. The inferior vena cava is normal in size with greater than 50% respiratory variability, suggesting right atrial pressure of 3 mmHg. FINDINGS  Left Ventricle: Left ventricular ejection fraction, by estimation, is 60 to 65%. The left ventricle has normal function. The left ventricle has no regional wall motion abnormalities. The left ventricular internal cavity size was normal in size. There is  no left ventricular hypertrophy. Left ventricular diastolic parameters were normal. Right Ventricle: The right ventricular size is normal. No increase in right ventricular wall thickness. Right ventricular systolic function is normal. Left Atrium: Left atrial size was normal in size. Right Atrium: Right atrial size was normal in size. Pericardium: There is no evidence of  pericardial effusion. Mitral Valve: The mitral valve is normal in structure. No evidence of mitral valve regurgitation. No evidence of mitral valve stenosis. MV peak gradient, 3.2 mmHg. The mean mitral valve gradient is 2.0 mmHg. Tricuspid Valve: The tricuspid valve is normal in structure. Tricuspid valve regurgitation is not demonstrated. No evidence of tricuspid stenosis. Aortic Valve: The aortic valve is normal in structure. Aortic valve regurgitation is not visualized. No aortic stenosis is present. Aortic valve mean gradient measures 4.0 mmHg. Aortic valve peak gradient measures 7.4 mmHg. Aortic valve area, by VTI measures 2.51 cm. Pulmonic Valve: The pulmonic valve was normal in structure. Pulmonic valve regurgitation is not visualized. No evidence of pulmonic stenosis. Aorta: The aortic root is normal in size and structure. Venous: The inferior vena cava is normal in size with greater than 50% respiratory variability, suggesting right atrial pressure of 3 mmHg. IAS/Shunts: No atrial level shunt detected by color flow Doppler.  LEFT VENTRICLE PLAX 2D LVIDd:         4.10 cm     Diastology LVIDs:         3.00 cm     LV e' medial:    7.83 cm/s LV PW:         0.90 cm     LV E/e' medial:  9.0 LV IVS:        0.90 cm     LV e' lateral:   11.10 cm/s LVOT diam:     2.00 cm     LV E/e' lateral: 6.3 LV SV:         60 LV SV Index:   36 LVOT Area:     3.14 cm  LV Volumes (MOD) LV vol d, MOD A2C: 84.3 ml LV vol d, MOD A4C: 72.0 ml LV vol s, MOD A2C: 32.2 ml LV vol s, MOD A4C: 26.5 ml LV SV MOD A2C:     52.1 ml LV SV MOD A4C:     72.0 ml LV SV MOD BP:      48.8 ml RIGHT VENTRICLE          IVC RV Basal diam:  2.80 cm  IVC diam: 1.80 cm RV Mid diam:  2.60 cm TAPSE (M-mode): 1.9 cm LEFT ATRIUM             Index        RIGHT ATRIUM           Index LA Vol (A2C):   32.1 ml 19.23 ml/m  RA Area:     11.20 cm LA Vol (A4C):   38.1 ml 22.82 ml/m  RA Volume:   22.70 ml  13.60 ml/m LA Biplane Vol: 37.5 ml 22.46 ml/m  AORTIC  VALVE                    PULMONIC VALVE AV Area (Vmax):    2.61 cm     PV Vmax:       0.96 m/s AV Area (Vmean):   2.41 cm     PV Peak grad:  3.6 mmHg AV Area (VTI):     2.51 cm AV Vmax:           136.00 cm/s AV Vmean:          98.600 cm/s AV VTI:            0.239 m AV Peak Grad:      7.4 mmHg AV Mean Grad:      4.0 mmHg LVOT Vmax:         113.00 cm/s LVOT Vmean:        75.600 cm/s LVOT VTI:          0.191 m LVOT/AV VTI ratio: 0.80  AORTA Ao Root diam: 3.30 cm MITRAL VALVE MV Area (PHT): 2.55 cm    SHUNTS MV Area VTI:   2.70 cm    Systemic VTI:  0.19 m MV Peak grad:  3.2 mmHg    Systemic Diam: 2.00 cm MV Mean grad:  2.0 mmHg MV Vmax:       0.90 m/s MV Vmean:      58.5 cm/s MV Decel Time: 297 msec MV E velocity: 70.20 cm/s MV A velocity: 76.50 cm/s MV E/A ratio:  0.92 Clearnce Hasten Electronically signed by Clearnce Hasten Signature Date/Time: 11/20/2023/12:14:13 PM    Final    MR BRAIN WO CONTRAST Result Date: 11/19/2023 CLINICAL DATA:  58 year old female code stroke presentation this morning. Left side deficits. EXAM: MRI HEAD WITHOUT CONTRAST TECHNIQUE: Multiplanar, multiecho pulse sequences of the brain and surrounding structures were obtained without intravenous contrast. COMPARISON:  CT head, CTA head and neck this morning. FINDINGS: Brain: No convincing restricted diffusion to suggest acute infarction. No midline shift, mass effect, evidence of mass lesion, ventriculomegaly, extra-axial collection or acute intracranial hemorrhage. Cervicomedullary junction and pituitary are within normal limits. Wallace Cullens and white matter signal is within normal limits for age throughout the brain. No cortical encephalomalacia. No chronic cerebral blood products on SWI. Deep gray nuclei, brainstem and cerebellum appear negative. Vascular: Major intracranial vascular flow voids are preserved. Skull and upper cervical spine: Negative. Normal bone marrow signal. Sinuses/Orbits: Negative. Other: Trace mastoid air cell fluid on  the left, appears inconsequential. Negative visible nasopharynx. Negative visible scalp and face. IMPRESSION: Normal for age noncontrast MRI appearance of the Brain. Electronically Signed   By: Odessa Fleming M.D.   On: 11/19/2023 10:20   CT ANGIO HEAD NECK W WO CM (CODE STROKE) Result Date: 11/19/2023 CLINICAL DATA:  Left facial droop and left arm numbness EXAM: CT ANGIOGRAPHY HEAD AND NECK WITH AND WITHOUT CONTRAST TECHNIQUE: Multidetector CT imaging of the head and neck was performed using the standard protocol during  bolus administration of intravenous contrast. Multiplanar CT image reconstructions and MIPs were obtained to evaluate the vascular anatomy. Carotid stenosis measurements (when applicable) are obtained utilizing NASCET criteria, using the distal internal carotid diameter as the denominator. RADIATION DOSE REDUCTION: This exam was performed according to the departmental dose-optimization program which includes automated exposure control, adjustment of the mA and/or kV according to patient size and/or use of iterative reconstruction technique. CONTRAST:  75mL OMNIPAQUE IOHEXOL 350 MG/ML SOLN COMPARISON:  Head CT from earlier today FINDINGS: CTA NECK FINDINGS Aortic arch: Mild atheromatous plaque Right carotid system: Mild low-density plaque suggested at the bifurcation. No ulceration or stenosis. Left carotid system: Mild low-density plaque at the bifurcation. No ulceration or stenosis Vertebral arteries: No proximal subclavian stenosis. The vertebral arteries are smoothly contoured and widely patent. Skeleton: No acute or aggressive finding Other neck: No acute finding Upper chest: Clear apical lungs Review of the MIP images confirms the above findings CTA HEAD FINDINGS Anterior circulation: Major vessels are smoothly contoured and diffusely patent. No branch occlusion, beading, or aneurysm Posterior circulation: The vertebral and basilar arteries are smoothly contoured and diffusely patent. No branch  occlusion, beading, or aneurysm Venous sinuses: Diffusely patent Anatomic variants: None significant Review of the MIP images confirms the above findings IMPRESSION: No emergent finding or flow reducing stenosis. Mild atherosclerosis. Electronically Signed   By: Tiburcio Pea M.D.   On: 11/19/2023 08:06   CT HEAD CODE STROKE WO CONTRAST Result Date: 11/19/2023 CLINICAL DATA:  Code stroke. Neuro deficit, acute, stroke suspected. Left-sided facial and arm numbness with left arm drift. EXAM: CT HEAD WITHOUT CONTRAST TECHNIQUE: Contiguous axial images were obtained from the base of the skull through the vertex without intravenous contrast. RADIATION DOSE REDUCTION: This exam was performed according to the departmental dose-optimization program which includes automated exposure control, adjustment of the mA and/or kV according to patient size and/or use of iterative reconstruction technique. COMPARISON:  Head CT 04/15/2013 FINDINGS: Brain: There is no evidence of an acute infarct, intracranial hemorrhage, mass, midline shift, or extra-axial fluid collection. Cerebral volume is normal. The ventricles are normal in size. Vascular: No hyperdense vessel or unexpected calcification. Skull: No fracture or suspicious lesion. Sinuses/Orbits: Visualized paranasal sinuses are clear. Trace left mastoid fluid. Unremarkable orbits. Other: None. ASPECTS (Alberta Stroke Program Early CT Score) - Ganglionic level infarction (caudate, lentiform nuclei, internal capsule, insula, M1-M3 cortex): 7 - Supraganglionic infarction (M4-M6 cortex): 3 Total score (0-10 with 10 being normal): 10 These results were communicated to Dr. Armanda Magic at 7:36 am on 11/19/2023 by text page via the [facility] messaging system. IMPRESSION: Negative head CT.  ASPECTS of 10. Electronically Signed   By: Sebastian Ache M.D.   On: 11/19/2023 07:38    Microbiology: No results found for this or any previous visit (from the past 240 hours).   Labs: Basic Metabolic  Panel: Recent Labs  Lab 11/19/23 0724 11/19/23 0727 11/20/23 0448  NA 138 140 140  K 3.9 3.9 3.8  CL 107 105 106  CO2 21*  --  23  GLUCOSE 113* 110* 92  BUN 10 9 14   CREATININE 0.62 0.60 0.63  CALCIUM 9.7  --  9.3   Liver Function Tests: Recent Labs  Lab 11/19/23 0724  AST 19  ALT 16  ALKPHOS 61  BILITOT 0.4  PROT 6.9  ALBUMIN 4.3   No results for input(s): "LIPASE", "AMYLASE" in the last 168 hours. No results for input(s): "AMMONIA" in the last 168 hours. CBC: Recent Labs  Lab 11/19/23 0724 11/19/23 0727 11/20/23 0448  WBC 4.7  --  5.0  NEUTROABS 2.4  --   --   HGB 14.5 14.6 14.1  HCT 41.9 43.0 40.6  MCV 87.3  --  87.7  PLT 290  --  269   Cardiac Enzymes: No results for input(s): "CKTOTAL", "CKMB", "CKMBINDEX", "TROPONINI" in the last 168 hours. BNP: BNP (last 3 results) No results for input(s): "BNP" in the last 8760 hours.  ProBNP (last 3 results) No results for input(s): "PROBNP" in the last 8760 hours.  CBG: Recent Labs  Lab 11/19/23 0758  GLUCAP 108*       Signed:  Ramiro Harvest MD.  [facility] 11/20/2023, 3:38 PM

## 2023-11-20 NOTE — Progress Notes (Addendum)
 DISCHARGE NOTE HOME Danielle Banks to be discharged Home per MD order. Discussed prescriptions and follow up appointments with the patient. Prescriptions given to patient; medication list explained in detail. Patient verbalized understanding.  Skin clean, dry and intact without evidence of skin break down, no evidence of skin tears noted. IV catheter discontinued intact. Site without signs and symptoms of complications. Dressing and pressure applied. Pt denies pain at the site currently. No complaints noted.  Patient free of lines, drains, and wounds.   An After Visit Summary (AVS) was printed and given to the patient. Patient escorted to car, and discharged home via private auto.  Velia Meyer, RN

## 2023-11-20 NOTE — Progress Notes (Signed)
 SLP Cancellation Note  Patient Details Name: Danielle Banks MRN: [id number] DOB: 07-Sep-1965   Cancelled treatment:       Reason Eval/Treat Not Completed: SLP screened, pt reports she has returned to baseline. No needs identified, will sign off.    Gwynneth Aliment, M.A., CF-SLP Speech Language Pathology, Acute Rehabilitation Services  Secure Chat preferred [phone]  11/20/2023, 10:04 AM

## 2023-11-20 NOTE — Progress Notes (Signed)
 PT Cancellation Note  Patient Details Name: Danielle Banks MRN: [id number] DOB: Sep 19, 1965   Cancelled Treatment:    Reason Eval/Treat Not Completed: Patient declined, no reason specified (pt politely declined any needs stating strength and function back to normal and pt denied P.T. eval)   Colyn Miron B Cherrise Occhipinti 11/20/2023, 9:25 AM Merryl Hacker, PT Acute Rehabilitation Services Office: [phone]

## 2023-11-25 LAB — VITAMIN B1: Vitamin B1 (Thiamine): 125 nmol/L (ref 66.5–200.0)

## 2023-12-10 ENCOUNTER — Ambulatory Visit (INDEPENDENT_AMBULATORY_CARE_PROVIDER_SITE_OTHER): Admitting: Psychology

## 2023-12-10 DIAGNOSIS — F411 Generalized anxiety disorder: Secondary | ICD-10-CM
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
## 2023-12-11 ENCOUNTER — Other Ambulatory Visit (HOSPITAL_COMMUNITY): Payer: Self-pay

## 2023-12-22 ENCOUNTER — Encounter: Payer: Self-pay | Admitting: Psychology

## 2023-12-24 ENCOUNTER — Ambulatory Visit: Admitting: Psychology

## 2023-12-25 ENCOUNTER — Ambulatory Visit: Admitting: Diagnostic Neuroimaging

## 2024-01-07 ENCOUNTER — Ambulatory Visit: Admitting: Psychology

## 2024-01-21 ENCOUNTER — Ambulatory Visit: Admitting: Psychology

## 2024-02-14 ENCOUNTER — Encounter: Admitting: Obstetrics and Gynecology

## 2024-06-23 ENCOUNTER — Encounter: Payer: Self-pay | Admitting: Radiology
